# Patient Record
Sex: Female | Born: 1970
Health system: Southern US, Community
[De-identification: ages and names within clinical notes are randomized; demographics above are authoritative.]

## PROBLEM LIST (undated history)

## (undated) DIAGNOSIS — G8929 Other chronic pain: Secondary | ICD-10-CM

## (undated) DIAGNOSIS — E559 Vitamin D deficiency, unspecified: Secondary | ICD-10-CM

## (undated) DIAGNOSIS — L719 Rosacea, unspecified: Secondary | ICD-10-CM

## (undated) DIAGNOSIS — F329 Major depressive disorder, single episode, unspecified: Secondary | ICD-10-CM

## (undated) DIAGNOSIS — M549 Dorsalgia, unspecified: Secondary | ICD-10-CM

## (undated) DIAGNOSIS — B019 Varicella without complication: Secondary | ICD-10-CM

## (undated) DIAGNOSIS — G4700 Insomnia, unspecified: Secondary | ICD-10-CM

## (undated) DIAGNOSIS — N390 Urinary tract infection, site not specified: Secondary | ICD-10-CM

## (undated) DIAGNOSIS — N946 Dysmenorrhea, unspecified: Secondary | ICD-10-CM

## (undated) DIAGNOSIS — J309 Allergic rhinitis, unspecified: Secondary | ICD-10-CM

## (undated) HISTORY — DX: Vitamin D deficiency, unspecified: E55.9

## (undated) HISTORY — DX: Varicella without complication: B01.9

## (undated) HISTORY — PX: DILATION AND CURETTAGE OF UTERUS: SHX78

## (undated) HISTORY — DX: Urinary tract infection, site not specified: N39.0

## (undated) HISTORY — DX: Other chronic pain: G89.29

## (undated) HISTORY — DX: Rosacea, unspecified: L71.9

## (undated) HISTORY — DX: Allergic rhinitis, unspecified: J30.9

## (undated) HISTORY — DX: Major depressive disorder, single episode, unspecified: F32.9

## (undated) HISTORY — DX: Dorsalgia, unspecified: M54.9

## (undated) HISTORY — DX: Dysmenorrhea, unspecified: N94.6

## (undated) HISTORY — DX: Insomnia, unspecified: G47.00

---

## 2004-12-27 ENCOUNTER — Other Ambulatory Visit: Admission: RE | Admit: 2004-12-27 | Discharge: 2004-12-27 | Payer: Self-pay | Admitting: Obstetrics and Gynecology

## 2005-09-09 ENCOUNTER — Inpatient Hospital Stay (HOSPITAL_COMMUNITY): Admission: AD | Admit: 2005-09-09 | Discharge: 2005-09-12 | Payer: Self-pay | Admitting: Obstetrics and Gynecology

## 2007-07-23 DIAGNOSIS — J309 Allergic rhinitis, unspecified: Secondary | ICD-10-CM

## 2007-07-23 HISTORY — DX: Allergic rhinitis, unspecified: J30.9

## 2010-02-16 DIAGNOSIS — L719 Rosacea, unspecified: Secondary | ICD-10-CM

## 2010-02-16 HISTORY — DX: Rosacea, unspecified: L71.9

## 2010-04-03 DIAGNOSIS — G4700 Insomnia, unspecified: Secondary | ICD-10-CM

## 2010-04-03 HISTORY — DX: Insomnia, unspecified: G47.00

## 2010-05-09 DIAGNOSIS — F32A Depression, unspecified: Secondary | ICD-10-CM

## 2010-05-09 HISTORY — DX: Depression, unspecified: F32.A

## 2010-09-13 DIAGNOSIS — E559 Vitamin D deficiency, unspecified: Secondary | ICD-10-CM | POA: Insufficient documentation

## 2010-09-13 DIAGNOSIS — G8929 Other chronic pain: Secondary | ICD-10-CM

## 2010-09-13 HISTORY — DX: Vitamin D deficiency, unspecified: E55.9

## 2010-09-13 HISTORY — DX: Other chronic pain: G89.29

## 2011-03-08 DIAGNOSIS — N946 Dysmenorrhea, unspecified: Secondary | ICD-10-CM

## 2011-03-08 HISTORY — DX: Dysmenorrhea, unspecified: N94.6

## 2015-07-12 LAB — BASIC METABOLIC PANEL
BUN: 8 (ref 4–21)
Creatinine: 0.7 (ref 0.5–1.1)
GLUCOSE: 97
POTASSIUM: 4.4 (ref 3.4–5.3)
SODIUM: 139 (ref 137–147)

## 2015-07-12 LAB — LIPID PANEL
CHOLESTEROL: 235 — AB (ref 0–200)
HDL: 51 (ref 35–70)
LDL Cholesterol: 152
TRIGLYCERIDES: 160 (ref 40–160)

## 2015-07-12 LAB — HEPATIC FUNCTION PANEL
ALT: 39 — AB (ref 7–35)
AST: 34 (ref 13–35)
Alkaline Phosphatase: 60 (ref 25–125)

## 2015-07-12 LAB — VITAMIN D 25 HYDROXY (VIT D DEFICIENCY, FRACTURES): Vit D, 25-Hydroxy: 23

## 2015-10-19 DIAGNOSIS — E785 Hyperlipidemia, unspecified: Secondary | ICD-10-CM | POA: Diagnosis not present

## 2015-10-19 LAB — LIPID PANEL
CHOLESTEROL: 218 — AB (ref 0–200)
HDL: 53 (ref 35–70)
LDL Cholesterol: 135
Triglycerides: 148 (ref 40–160)

## 2016-01-25 DIAGNOSIS — L218 Other seborrheic dermatitis: Secondary | ICD-10-CM | POA: Diagnosis not present

## 2016-01-25 DIAGNOSIS — H0261 Xanthelasma of right upper eyelid: Secondary | ICD-10-CM | POA: Diagnosis not present

## 2016-01-25 DIAGNOSIS — H0264 Xanthelasma of left upper eyelid: Secondary | ICD-10-CM | POA: Diagnosis not present

## 2016-02-13 DIAGNOSIS — H0261 Xanthelasma of right upper eyelid: Secondary | ICD-10-CM | POA: Diagnosis not present

## 2016-02-13 DIAGNOSIS — H0264 Xanthelasma of left upper eyelid: Secondary | ICD-10-CM | POA: Diagnosis not present

## 2016-03-19 DIAGNOSIS — H0261 Xanthelasma of right upper eyelid: Secondary | ICD-10-CM | POA: Diagnosis not present

## 2016-03-19 DIAGNOSIS — H0264 Xanthelasma of left upper eyelid: Secondary | ICD-10-CM | POA: Diagnosis not present

## 2016-04-24 DIAGNOSIS — H0261 Xanthelasma of right upper eyelid: Secondary | ICD-10-CM | POA: Diagnosis not present

## 2016-04-24 DIAGNOSIS — H0264 Xanthelasma of left upper eyelid: Secondary | ICD-10-CM | POA: Diagnosis not present

## 2016-08-04 DIAGNOSIS — R69 Illness, unspecified: Secondary | ICD-10-CM | POA: Diagnosis not present

## 2017-05-28 DIAGNOSIS — M9904 Segmental and somatic dysfunction of sacral region: Secondary | ICD-10-CM | POA: Diagnosis not present

## 2017-05-28 DIAGNOSIS — M5137 Other intervertebral disc degeneration, lumbosacral region: Secondary | ICD-10-CM | POA: Diagnosis not present

## 2017-05-28 DIAGNOSIS — M9902 Segmental and somatic dysfunction of thoracic region: Secondary | ICD-10-CM | POA: Diagnosis not present

## 2017-05-28 DIAGNOSIS — M9903 Segmental and somatic dysfunction of lumbar region: Secondary | ICD-10-CM | POA: Diagnosis not present

## 2017-05-30 DIAGNOSIS — M9902 Segmental and somatic dysfunction of thoracic region: Secondary | ICD-10-CM | POA: Diagnosis not present

## 2017-05-30 DIAGNOSIS — M5137 Other intervertebral disc degeneration, lumbosacral region: Secondary | ICD-10-CM | POA: Diagnosis not present

## 2017-05-30 DIAGNOSIS — M9903 Segmental and somatic dysfunction of lumbar region: Secondary | ICD-10-CM | POA: Diagnosis not present

## 2017-05-30 DIAGNOSIS — M9904 Segmental and somatic dysfunction of sacral region: Secondary | ICD-10-CM | POA: Diagnosis not present

## 2017-06-03 DIAGNOSIS — M5137 Other intervertebral disc degeneration, lumbosacral region: Secondary | ICD-10-CM | POA: Diagnosis not present

## 2017-06-03 DIAGNOSIS — M9903 Segmental and somatic dysfunction of lumbar region: Secondary | ICD-10-CM | POA: Diagnosis not present

## 2017-06-03 DIAGNOSIS — M9902 Segmental and somatic dysfunction of thoracic region: Secondary | ICD-10-CM | POA: Diagnosis not present

## 2017-06-03 DIAGNOSIS — M9904 Segmental and somatic dysfunction of sacral region: Secondary | ICD-10-CM | POA: Diagnosis not present

## 2017-06-06 DIAGNOSIS — M5137 Other intervertebral disc degeneration, lumbosacral region: Secondary | ICD-10-CM | POA: Diagnosis not present

## 2017-06-06 DIAGNOSIS — M9903 Segmental and somatic dysfunction of lumbar region: Secondary | ICD-10-CM | POA: Diagnosis not present

## 2017-06-06 DIAGNOSIS — M9904 Segmental and somatic dysfunction of sacral region: Secondary | ICD-10-CM | POA: Diagnosis not present

## 2017-06-06 DIAGNOSIS — M9902 Segmental and somatic dysfunction of thoracic region: Secondary | ICD-10-CM | POA: Diagnosis not present

## 2017-07-29 ENCOUNTER — Encounter: Payer: Self-pay | Admitting: *Deleted

## 2017-08-06 DIAGNOSIS — M5137 Other intervertebral disc degeneration, lumbosacral region: Secondary | ICD-10-CM | POA: Diagnosis not present

## 2017-08-06 DIAGNOSIS — M9903 Segmental and somatic dysfunction of lumbar region: Secondary | ICD-10-CM | POA: Diagnosis not present

## 2017-08-06 DIAGNOSIS — M9904 Segmental and somatic dysfunction of sacral region: Secondary | ICD-10-CM | POA: Diagnosis not present

## 2017-08-06 DIAGNOSIS — M9902 Segmental and somatic dysfunction of thoracic region: Secondary | ICD-10-CM | POA: Diagnosis not present

## 2017-08-13 ENCOUNTER — Encounter: Payer: Self-pay | Admitting: Family Medicine

## 2017-08-13 ENCOUNTER — Ambulatory Visit (INDEPENDENT_AMBULATORY_CARE_PROVIDER_SITE_OTHER): Payer: BLUE CROSS/BLUE SHIELD | Admitting: Family Medicine

## 2017-08-13 VITALS — BP 131/89 | HR 86 | Temp 98.5°F | Ht 67.0 in | Wt 181.0 lb

## 2017-08-13 DIAGNOSIS — F40243 Fear of flying: Secondary | ICD-10-CM | POA: Diagnosis not present

## 2017-08-13 DIAGNOSIS — Z0000 Encounter for general adult medical examination without abnormal findings: Secondary | ICD-10-CM

## 2017-08-13 DIAGNOSIS — Z79899 Other long term (current) drug therapy: Secondary | ICD-10-CM

## 2017-08-13 DIAGNOSIS — E782 Mixed hyperlipidemia: Secondary | ICD-10-CM | POA: Diagnosis not present

## 2017-08-13 DIAGNOSIS — E559 Vitamin D deficiency, unspecified: Secondary | ICD-10-CM

## 2017-08-13 DIAGNOSIS — L719 Rosacea, unspecified: Secondary | ICD-10-CM | POA: Diagnosis not present

## 2017-08-13 DIAGNOSIS — Z7689 Persons encountering health services in other specified circumstances: Secondary | ICD-10-CM | POA: Diagnosis not present

## 2017-08-13 DIAGNOSIS — Z131 Encounter for screening for diabetes mellitus: Secondary | ICD-10-CM

## 2017-08-13 DIAGNOSIS — E663 Overweight: Secondary | ICD-10-CM

## 2017-08-13 DIAGNOSIS — Z130 Encounter for screening for diseases of the blood and blood-forming organs and certain disorders involving the immune mechanism: Secondary | ICD-10-CM | POA: Diagnosis not present

## 2017-08-13 LAB — COMPREHENSIVE METABOLIC PANEL
ALT: 63 U/L — AB (ref 0–35)
AST: 58 U/L — AB (ref 0–37)
Albumin: 4.1 g/dL (ref 3.5–5.2)
Alkaline Phosphatase: 57 U/L (ref 39–117)
BILIRUBIN TOTAL: 0.4 mg/dL (ref 0.2–1.2)
BUN: 11 mg/dL (ref 6–23)
CHLORIDE: 104 meq/L (ref 96–112)
CO2: 26 mEq/L (ref 19–32)
Calcium: 9.2 mg/dL (ref 8.4–10.5)
Creatinine, Ser: 0.71 mg/dL (ref 0.40–1.20)
GFR: 93.42 mL/min (ref >60.00)
GLUCOSE: 114 mg/dL — AB (ref 70–99)
Potassium: 4.3 mEq/L (ref 3.5–5.1)
Sodium: 141 mEq/L (ref 135–145)
Total Protein: 7.3 g/dL (ref 6.0–8.3)

## 2017-08-13 LAB — LIPID PANEL
CHOL/HDL RATIO: 5
Cholesterol: 247 mg/dL — ABNORMAL HIGH (ref 0–200)
HDL: 53.1 mg/dL (ref >39.00)
LDL CALC: 156 mg/dL — AB (ref 0–99)
NONHDL: 193.74
Triglycerides: 189 mg/dL — ABNORMAL HIGH (ref 0.0–149.0)
VLDL: 37.8 mg/dL (ref 0.0–40.0)

## 2017-08-13 LAB — CBC WITH DIFFERENTIAL/PLATELET
BASOS ABS: 0 10*3/uL (ref 0.0–0.1)
Basophils Relative: 0.9 % (ref 0.0–3.0)
EOS ABS: 0.2 10*3/uL (ref 0.0–0.7)
Eosinophils Relative: 3.7 % (ref 0.0–5.0)
HCT: 46.2 % — ABNORMAL HIGH (ref 36.0–46.0)
Hemoglobin: 15.7 g/dL — ABNORMAL HIGH (ref 12.0–15.0)
LYMPHS ABS: 1.2 10*3/uL (ref 0.7–4.0)
LYMPHS PCT: 23.7 % (ref 12.0–46.0)
MCHC: 33.9 g/dL (ref 30.0–36.0)
MCV: 100.8 fl — ABNORMAL HIGH (ref 78.0–100.0)
Monocytes Absolute: 0.5 10*3/uL (ref 0.1–1.0)
Monocytes Relative: 9.5 % (ref 3.0–12.0)
NEUTROS ABS: 3.2 10*3/uL (ref 1.4–7.7)
NEUTROS PCT: 62.2 % (ref 43.0–77.0)
PLATELETS: 202 10*3/uL (ref 150.0–400.0)
RBC: 4.59 Mil/uL (ref 3.87–5.11)
RDW: 13.1 % (ref 11.5–15.5)
WBC: 5.1 10*3/uL (ref 4.0–10.5)

## 2017-08-13 LAB — HEMOGLOBIN A1C: Hgb A1c MFr Bld: 6 % (ref 4.6–6.5)

## 2017-08-13 LAB — TSH: TSH: 2.08 u[IU]/mL (ref 0.35–4.50)

## 2017-08-13 LAB — VITAMIN D 25 HYDROXY (VIT D DEFICIENCY, FRACTURES): VITD: 25.09 ng/mL — AB (ref 30.00–100.00)

## 2017-08-13 NOTE — Patient Instructions (Signed)
It was nice to meet you today.   Health Maintenance, Female Adopting a healthy lifestyle and getting preventive care can go a long way to promote health and wellness. Talk with your health care provider about what schedule of regular examinations is right for you. This is a good chance for you to check in with your provider about disease prevention and staying healthy. In between checkups, there are plenty of things you can do on your own. Experts have done a lot of research about which lifestyle changes and preventive measures are most likely to keep you healthy. Ask your health care provider for more information. Weight and diet Eat a healthy diet  Be sure to include plenty of vegetables, fruits, low-fat dairy products, and lean protein.  Do not eat a lot of foods high in solid fats, added sugars, or salt.  Get regular exercise. This is one of the most important things you can do for your health. ? Most adults should exercise for at least 150 minutes each week. The exercise should increase your heart rate and make you sweat (moderate-intensity exercise). ? Most adults should also do strengthening exercises at least twice a week. This is in addition to the moderate-intensity exercise.  Maintain a healthy weight  Body mass index (BMI) is a measurement that can be used to identify possible weight problems. It estimates body fat based on height and weight. Your health care provider can help determine your BMI and help you achieve or maintain a healthy weight.  For females 70 years of age and older: ? A BMI below 18.5 is considered underweight. ? A BMI of 18.5 to 24.9 is normal. ? A BMI of 25 to 29.9 is considered overweight. ? A BMI of 30 and above is considered obese.  Watch levels of cholesterol and blood lipids  You should start having your blood tested for lipids and cholesterol at 48 years of age, then have this test every 5 years.  You may need to have your cholesterol levels  checked more often if: ? Your lipid or cholesterol levels are high. ? You are older than 48 years of age. ? You are at high risk for heart disease.  Cancer screening Lung Cancer  Lung cancer screening is recommended for adults 38-28 years old who are at high risk for lung cancer because of a history of smoking.  A yearly low-dose CT scan of the lungs is recommended for people who: ? Currently smoke. ? Have quit within the past 15 years. ? Have at least a 30-pack-year history of smoking. A pack year is smoking an average of one pack of cigarettes a day for 1 year.  Yearly screening should continue until it has been 15 years since you quit.  Yearly screening should stop if you develop a health problem that would prevent you from having lung cancer treatment.  Breast Cancer  Practice breast self-awareness. This means understanding how your breasts normally appear and feel.  It also means doing regular breast self-exams. Let your health care provider know about any changes, no matter how small.  If you are in your 20s or 30s, you should have a clinical breast exam (CBE) by a health care provider every 1-3 years as part of a regular health exam.  If you are 41 or older, have a CBE every year. Also consider having a breast X-ray (mammogram) every year.  If you have a family history of breast cancer, talk to your health care provider about  If you are at high risk for breast cancer, talk to your health care provider about having an MRI and a mammogram every year.  Breast cancer gene (BRCA) assessment is recommended for women who have family members with BRCA-related cancers. BRCA-related cancers include: ? Breast. ? Ovarian. ? Tubal. ? Peritoneal cancers.  Results of the assessment will determine the need for genetic counseling and BRCA1 and BRCA2 testing.  Cervical Cancer Your health care provider may recommend that you be screened regularly for cancer of the  pelvic organs (ovaries, uterus, and vagina). This screening involves a pelvic examination, including checking for microscopic changes to the surface of your cervix (Pap test). You may be encouraged to have this screening done every 3 years, beginning at age 21.  For women ages 30-65, health care providers may recommend pelvic exams and Pap testing every 3 years, or they may recommend the Pap and pelvic exam, combined with testing for human papilloma virus (HPV), every 5 years. Some types of HPV increase your risk of cervical cancer. Testing for HPV may also be done on women of any age with unclear Pap test results.  Other health care providers may not recommend any screening for nonpregnant women who are considered low risk for pelvic cancer and who do not have symptoms. Ask your health care provider if a screening pelvic exam is right for you.  If you have had past treatment for cervical cancer or a condition that could lead to cancer, you need Pap tests and screening for cancer for at least 20 years after your treatment. If Pap tests have been discontinued, your risk factors (such as having a new sexual partner) need to be reassessed to determine if screening should resume. Some women have medical problems that increase the chance of getting cervical cancer. In these cases, your health care provider may recommend more frequent screening and Pap tests.  Colorectal Cancer  This type of cancer can be detected and often prevented.  Routine colorectal cancer screening usually begins at 48 years of age and continues through 48 years of age.  Your health care provider may recommend screening at an earlier age if you have risk factors for colon cancer.  Your health care provider may also recommend using home test kits to check for hidden blood in the stool.  A small camera at the end of a tube can be used to examine your colon directly (sigmoidoscopy or colonoscopy). This is done to check for the  earliest forms of colorectal cancer.  Routine screening usually begins at age 50.  Direct examination of the colon should be repeated every 5-10 years through 48 years of age. However, you may need to be screened more often if early forms of precancerous polyps or small growths are found.  Skin Cancer  Check your skin from head to toe regularly.  Tell your health care provider about any new moles or changes in moles, especially if there is a change in a mole's shape or color.  Also tell your health care provider if you have a mole that is larger than the size of a pencil eraser.  Always use sunscreen. Apply sunscreen liberally and repeatedly throughout the day.  Protect yourself by wearing long sleeves, pants, a wide-brimmed hat, and sunglasses whenever you are outside.  Heart disease, diabetes, and high blood pressure  High blood pressure causes heart disease and increases the risk of stroke. High blood pressure is more likely to develop in: ? People who have blood   who have blood pressure in the high end of the normal range (130-139/85-89 mm Hg). ? People who are overweight or obese. ? People who are African American.  If you are 5-22 years of age, have your blood pressure checked every 3-5 years. If you are 69 years of age or older, have your blood pressure checked every year. You should have your blood pressure measured twice-once when you are at a hospital or clinic, and once when you are not at a hospital or clinic. Record the average of the two measurements. To check your blood pressure when you are not at a hospital or clinic, you can use: ? An automated blood pressure machine at a pharmacy. ? A home blood pressure monitor.  If you are between 29 years and 27 years old, ask your health care provider if you should take aspirin to prevent strokes.  Have regular diabetes screenings. This involves taking a blood sample to check your fasting blood sugar level. ? If you are at a normal weight and  have a low risk for diabetes, have this test once every three years after 48 years of age. ? If you are overweight and have a high risk for diabetes, consider being tested at a younger age or more often. Preventing infection Hepatitis B  If you have a higher risk for hepatitis B, you should be screened for this virus. You are considered at high risk for hepatitis B if: ? You were born in a country where hepatitis B is common. Ask your health care provider which countries are considered high risk. ? Your parents were born in a high-risk country, and you have not been immunized against hepatitis B (hepatitis B vaccine). ? You have HIV or AIDS. ? You use needles to inject street drugs. ? You live with someone who has hepatitis B. ? You have had sex with someone who has hepatitis B. ? You get hemodialysis treatment. ? You take certain medicines for conditions, including cancer, organ transplantation, and autoimmune conditions.  Hepatitis C  Blood testing is recommended for: ? Everyone born from [birth year] through 1966. ? Anyone with known risk factors for hepatitis C.  Sexually transmitted infections (STIs)  You should be screened for sexually transmitted infections (STIs) including gonorrhea and chlamydia if: ? You are sexually active and are younger than 48 years of age. ? You are older than 48 years of age and your health care provider tells you that you are at risk for this type of infection. ? Your sexual activity has changed since you were last screened and you are at an increased risk for chlamydia or gonorrhea. Ask your health care provider if you are at risk.  If you do not have HIV, but are at risk, it may be recommended that you take a prescription medicine daily to prevent HIV infection. This is called pre-exposure prophylaxis (PrEP). You are considered at risk if: ? You are sexually active and do not regularly use condoms or know the HIV status of your partner(s). ? You take drugs by  injection. ? You are sexually active with a partner who has HIV.  Talk with your health care provider about whether you are at high risk of being infected with HIV. If you choose to begin PrEP, you should first be tested for HIV. You should then be tested every 3 months for as long as you are taking PrEP. Pregnancy  If you are premenopausal and you may become pregnant, ask your health care  provider about preconception counseling.  If you may become pregnant, take 400 to 800 micrograms (mcg) of folic acid every day.  If you want to prevent pregnancy, talk to your health care provider about birth control (contraception). Osteoporosis and menopause  Osteoporosis is a disease in which the bones lose minerals and strength with aging. This can result in serious bone fractures. Your risk for osteoporosis can be identified using a bone density scan.  If you are 85 years of age or older, or if you are at risk for osteoporosis and fractures, ask your health care provider if you should be screened.  Ask your health care provider whether you should take a calcium or vitamin D supplement to lower your risk for osteoporosis.  Menopause may have certain physical symptoms and risks.  Hormone replacement therapy may reduce some of these symptoms and risks. Talk to your health care provider about whether hormone replacement therapy is right for you. Follow these instructions at home:  Schedule regular health, dental, and eye exams.  Stay current with your immunizations.  Do not use any tobacco products including cigarettes, chewing tobacco, or electronic cigarettes.  If you are pregnant, do not drink alcohol.  If you are breastfeeding, limit how much and how often you drink alcohol.  Limit alcohol intake to no more than 1 drink per day for nonpregnant women. One drink equals 12 ounces of beer, 5 ounces of wine, or 1 ounces of hard liquor.  Do not use street drugs.  Do not share needles.  Ask  your health care provider for help if you need support or information about quitting drugs.  Tell your health care provider if you often feel depressed.  Tell your health care provider if you have ever been abused or do not feel safe at home. This information is not intended to replace advice given to you by your health care provider. Make sure you discuss any questions you have with your health care provider. Document Released: 11/19/2010 Document Revised: 10/12/2015 Document Reviewed: 02/07/2015 Elsevier Interactive Patient Education  2018 [employer].   Please help [place] help you:  We are honored you have chosen [facility] for your Primary Care home. Below you will find basic instructions that you may need to access in the future. Please help [place] help you by reading the instructions, which cover many of the frequent questions we experience.   Prescription refills and request:  -In order to allow more efficient response time, please call your pharmacy for all refills. They will forward the request electronically to [place]. This allows for the quickest possible response. Request left on a nurse line can take longer to refill, since these are checked as time allows between office patients and other phone calls.  - refill request can take up to 3-5 working days to complete.  - If request is sent electronically and request is appropiate, it is usually completed in 1-2 business days.  - all patients will need to be seen routinely for all chronic medical conditions requiring prescription medications (see follow-up below). If you are overdue for follow up on your condition, you will be asked to make an appointment and we will call in enough medication to cover you until your appointment (up to 30 days).  - all controlled substances will require a face to face visit to request/refill.  - if you desire your prescriptions to go through a new pharmacy, and have an active script at original pharmacy, you  will need  to call your pharmacy and have scripts transferred to new pharmacy. This is completed between the pharmacy locations and not by your provider.    Results: If any images or labs were ordered, it can take up to 1 week to get results depending on the test ordered and the lab/facility running and resulting the test. - Normal or stable results, which do not need further discussion, may be released to your mychart immediately with attached note to you. A call may not be generated for normal results. Please make certain to sign up for mychart. If you have questions on how to activate your mychart you can call the front office.  - If your results need further discussion, our office will attempt to contact you via phone, and if unable to reach you after 2 attempts, we will release your abnormal result to your mychart with instructions.  - All results will be automatically released in mychart after 1 week.  - Your provider will provide you with explanation and instruction on all relevant material in your results. Please keep in mind, results and labs may appear confusing or abnormal to the untrained eye, but it does not mean they are actually abnormal for you personally. If you have any questions about your results that are not covered, or you desire more detailed explanation than what was provided, you should make an appointment with your provider to do so.   Our office handles many outgoing and incoming calls daily. If we have not contacted you within 1 week about your results, please check your mychart to see if there is a message first and if not, then contact our office.  In helping with this matter, you help decrease call volume, and therefore allow [place] to be able to respond to patients needs more efficiently.   Acute office visits (sick visit):  An acute visit is intended for a new problem and are scheduled in shorter time slots to allow schedule openings for patients with new problems. This is the  appropriate visit to discuss a new problem. In order to provide you with excellent quality medical care with proper time for you to explain your problem, have an exam and receive treatment with instructions, these appointments should be limited to one new problem per visit. If you experience a new problem, in which you desire to be addressed, please make an acute office visit, we save openings on the schedule to accommodate you. Please do not save your new problem for any other type of visit, let us take care of it properly and quickly for you.   Follow up visits:  Depending on your condition(s) your provider will need to see you routinely in order to provide you with quality care and prescribe medication(s). Most chronic conditions (Example: hypertension, Diabetes, depression/anxiety... etc), require visits a couple times a year. Your provider will instruct you on proper follow up for your personal medical conditions and history. Please make certain to make follow up appointments for your condition as instructed. Failing to do so could result in lapse in your medication treatment/refills. If you request a refill, and are overdue to be seen on a condition, we will always provide you with a 30 day script (once) to allow you time to schedule.    Medicare wellness (well visit): - we have a wonderful Nurse Maudie Mercury), that will meet with you and provide you will yearly medicare wellness visits. These visits should occur yearly (can not be scheduled less than 1 calendar year  apart) and cover preventive health, immunizations, advance directives and screenings you are entitled to yearly through your medicare benefits. Do not miss out on your entitled benefits, this is when medicare will pay for these benefits to be ordered for you.  These are strongly encouraged by your provider and is the appropriate type of visit to make certain you are up to date with all preventive health benefits. If you have not had your medicare  wellness exam in the last 12 months, please make certain to schedule one by calling the office and schedule your medicare wellness with Maudie Mercury as soon as possible.   Yearly physical (well visit):  - Adults are recommended to be seen yearly for physicals. Check with your insurance and date of your last physical, most insurances require one calendar year between physicals. Physicals include all preventive health topics, screenings, medical exam and labs that are appropriate for gender/age and history. You may have fasting labs needed at this visit. This is a well visit (not a sick visit), new problems should not be covered during this visit (see acute visit).  - Pediatric patients are seen more frequently when they are younger. Your provider will advise you on well child visit timing that is appropriate for your their age. - This is not a medicare wellness visit. Medicare wellness exams do not have an exam portion to the visit. Some medicare companies allow for a physical, some do not allow a yearly physical. If your medicare allows a yearly physical you can schedule the medicare wellness with our nurse Maudie Mercury and have your physical with your provider after, on the same day. Please check with insurance for your full benefits.   Late Policy/No Shows:  - all new patients should arrive 15-30 minutes earlier than appointment to allow [place] time  to  obtain all personal demographics,  insurance information and for you to complete office paperwork. - All established patients should arrive 10-15 minutes earlier than appointment time to update all information and be checked in .  - In our best efforts to run on time, if you are late for your appointment you will be asked to either reschedule or if able, we will work you back into the schedule. There will be a wait time to work you back in the schedule,  depending on availability.  - If you are unable to make it to your appointment as scheduled, please call 24 hours ahead of  time to allow [place] to fill the time slot with someone else who needs to be seen. If you do not cancel your appointment ahead of time, you may be charged a no show fee.

## 2017-08-13 NOTE — Progress Notes (Signed)
Patient ID: Kristin Forbes, female  DOB: 02/25/1971, 47 y.o.   MRN: [id number] Patient Care Team    Relationship Specialty Notifications Start End  Natalia LeatherwoodKuneff, Renee A, DO PCP - General Family Medicine  08/13/17   Ginette [place], [facility]    08/13/17   Arminda ResidesJones, Daniel, MD Consulting Physician Dermatology  08/13/17     Chief Complaint  Patient presents with  . Establish Care    pt is fasting wants a physical.    Subjective:  Kristin Forbes is a 47 y.o.  female present for new patient establishment. All past medical history, surgical history, allergies, family history, immunizations, medications and social history were obtained and entered in the electronic medical record today. All recent labs, ED visits and hospitalizations within the last year were reviewed.  Health maintenance:  Colonoscopy: no fhx, screen at 49 Mammogram: Possible Breast cancer in [place], but not sure. completed: 2017, birads pt reported normal. Dr. Arelia SneddonMcComb office. She has an upcoming appt in May and will have mammogram at that time.  Cervical cancer screening: last pap: 2017, normal per patient.  Completed by: Dr. Arelia SneddonMcComb Immunizations: tdap UTD 09/2008 , Influenza (encouraged yearly) Infectious disease screening: HIV w/ pregnancy, declined repeat testing DEXA: screen at 59 Assistive device: None  Oxygen use: None Patient has a Dental home. Hospitalizations/ED visits: reviewed  [facility] 08/[facility]/2017  Decreased Interest 0  Down, Depressed, Hopeless 0  PHQ - 2 Score 0  Altered sleeping 3  Tired, decreased energy 1  Change in appetite 0  Feeling bad or failure about yourself  0  Trouble concentrating 0  Moving slowly or fidgety/restless 0  Suicidal thoughts 0  PHQ-9 Score 4  Difficult doing work/chores Not difficult at all   GAD 7 : Generalized Anxiety Score 08/13/2017  Nervous, Anxious, on Edge 0  Control/stop worrying 0  Worry too much - different things 0  Trouble  relaxing 0  Restless 0  Easily annoyed or irritable 0  Afraid - awful might happen 0  Total GAD 7 Score 0  Anxiety Difficulty Not difficult at all    Current Exercise Habits: Home exercise routine, Type of exercise: walking, Time (Minutes): 40, Frequency (Times/Week): 2, Weekly Exercise (Minutes/Week): 80, Intensity: Mild   No flowsheet data found.   Immunization History  Administered Date(s) Administered  . Influenza-Unspecified 03/08/2011  . Tdap 09/20/2008    No exam data present  Past Medical History:  Diagnosis Date  . Allergic rhinitis 07/23/2007  . Chicken pox   . Chronic back pain 09/13/2010  . Depression 05/09/2010  . Dysmenorrhea 03/08/2011  . Insomnia 04/03/2010  . Rosacea 02/16/2010  . UTI (urinary tract infection)   . Vitamin D deficiency 09/13/2010   No Known Allergies Past Surgical History:  Procedure Laterality Date  . DILATION AND CURETTAGE OF UTERUS     Family History  Problem Relation Age of Onset  . CAD Mother   . Heart attack Mother 2660  . Heart disease Mother   . Pulmonary fibrosis Mother   . Nephrolithiasis Father   . AAA (abdominal aortic aneurysm) Maternal Grandfather   . Stroke Paternal Grandmother   . Nephrolithiasis Paternal Grandfather    Social History   Socioeconomic History  . Marital status: Married    Spouse name: Not on file  . Number of children: Not on file  . Years of education: Not on file  . Highest education level: Not on file  Occupational History  . Occupation:  Data Analyst  Social Needs  . Financial resource strain: Not on file  . Food insecurity:    Worry: Not on file    Inability: Not on file  . Transportation needs:    Medical: Not on file    Non-medical: Not on file  Tobacco Use  . Smoking status: Never Smoker  . Smokeless tobacco: Never Used  Substance and Sexual Activity  . Alcohol use: Yes    Alcohol/week: 1.8 oz    Types: 3 Glasses of wine per week    Comment: 1-3 glasses of wine weekly  .  Drug use: Never  . Sexual activity: Yes    Partners: Male    Birth control/protection: None  Lifestyle  . Physical activity:    Days per week: Not on file    Minutes per session: Not on file  . Stress: Not on file  Relationships  . Social connections:    Talks on phone: Not on file    Gets together: Not on file    Attends religious service: Not on file    Active member of club or organization: Not on file    Attends meetings of clubs or organizations: Not on file    Relationship status: Not on file  . Intimate partner violence:    Fear of current or ex partner: Not on file    Emotionally abused: Not on file    Physically abused: Not on file    Forced sexual activity: Not on file  Other Topics Concern  . Not on file  Social History Narrative   Married.  Children.   Bachelors in Occupational psychologist.  Works as a Warden/ranger.   Smoke alarms in the home.  Wears her seatbelt.   Feels safe in her relationships.   Allergies as of 08/13/2017   No Known Allergies     Medication List        Accurate as of 08/13/17 12:23 PM. Always use your most recent med list.          DOXYCYCLINE HYCLATE PO Take by mouth.   XANAX PO Take by mouth.       All past medical history, surgical history, allergies, family history, immunizations andmedications were updated in the EMR today and reviewed under the history and medication portions of their EMR.    No results found for this or any previous visit (from the past 2160 hour(s)).  No results found.   ROS: 14 pt review of systems performed and negative (unless mentioned in an HPI)  Objective: BP 131/89 (BP Location: Left Arm, Patient Position: Sitting, Cuff Size: Large)   Pulse 86   Temp 98.5 F (36.9 C) (Oral)   Ht 5\' 7"  (1.702 m)   Wt 181 lb (82.1 kg)   LMP 08/01/2017   SpO2 97%   BMI 28.35 kg/m  Gen: Afebrile. No acute distress. Nontoxic in appearance, well-developed, well-nourished, overweight, pleasant Caucasian female. HENT:  AT. [place]. Bilateral TM visualized and normal in appearance, normal external auditory canal. MMM, no oral lesions, adequate dentition. Bilateral nares within normal limits. Throat without erythema, ulcerations or exudates.  No cough on exam, no hoarseness on exam. Eyes:Pupils Equal Round Reactive to light, Extraocular movements intact,  Conjunctiva without redness, discharge or icterus. Neck/lymp/endocrine: Supple, no lymphadenopathy, no thyromegaly CV: RRR no murmur, no edema, +2/4 P posterior tibialis pulses.  No carotid bruits. No JVD. Chest: CTAB, no wheeze, rhonchi or crackles.  Normal respiratory effort.  Good air movement. Abd: Soft.  Flat. NTND. BS present.  No masses palpated. No hepatosplenomegaly. No rebound tenderness or guarding. Skin: No rashes, purpura or petechiae. Warm and well-perfused. Skin intact. Neuro/Msk:  Normal gait. PERLA. EOMi. Alert. Oriented x3.  Cranial nerves II through XII intact. Muscle strength 5/5 upper/lower extremity. DTRs equal bilaterally. Psych: Normal affect, dress and demeanor. Normal speech. Normal thought content and judgment.   Assessment/plan: Kristin Forbes is a 47 y.o. female present for patient establishment with a physical. Mixed hyperlipidemia Patient reports she does not take any medications or fish oil supplement for this condition. - Lipid panel - TSH Rosacea Patient is established with dermatology for condition.  Takes daily doxycycline to control. Flying phobia Prescribed low-dose Xanax in the past by prior providers.  She does not need refills today.  Patient is aware this is a controlled substance.  If needing refills she will need to be seen face-to-face.  She reports she understands this policy and law. Diabetes mellitus screening - Hemoglobin A1c Screening for deficiency anemia - CBC with Differential/Platelet Encounter for long-term current use of medication -Daily doxycycline prescribed by dermatology for rosacea -  Comprehensive metabolic panel Vitamin D deficiency -Currently takes a over-the-counter supplementation, she is uncertain how many units. - Vitamin D (25 hydroxy) Overweight (BMI 25.0-29.9) Exercise greater than 150 minutes a week. Encounter for preventive health examination/new patient Patient was encouraged to exercise greater than 150 minutes a week. Patient was encouraged to choose a diet filled with fresh fruits and vegetables, and lean meats. AVS provided to patient today for education/recommendation on gender specific health and safety maintenance.  Return in about 1 year (around 08/14/2018) for CPE.   Note is dictated utilizing voice recognition software. Although note has been proof read prior to signing, occasional typographical errors still can be missed. If any questions arise, please do not hesitate to call for verification.  Electronically signed by: Felix Pacini, DO  [facility] [place]

## 2017-08-14 ENCOUNTER — Telehealth: Payer: Self-pay | Admitting: Family Medicine

## 2017-08-14 DIAGNOSIS — R7989 Other specified abnormal findings of blood chemistry: Secondary | ICD-10-CM | POA: Insufficient documentation

## 2017-08-14 DIAGNOSIS — R945 Abnormal results of liver function studies: Principal | ICD-10-CM

## 2017-08-14 DIAGNOSIS — R7303 Prediabetes: Secondary | ICD-10-CM | POA: Insufficient documentation

## 2017-08-14 NOTE — Telephone Encounter (Signed)
Please inform patient the following information: - her diabetes screen is "prediabetes" at 6.0. No medication needed at this time, however we will need to monitor every 6 months to ensure she doe snot progress to diabetes. Lower sugar/carbs encouraged.  - vit d is mildly low at 25. She reported she was taking a supplement but did not know how much. Would recommend at least 1000u since she is low. If already taking more than 1000u increase by one extra dose a week.  - her liver enzymes are elevated. I do not have prior records or labs for her to know if this is a change. Some people with elevated cholesterol have "fatty liver disease" which can cause liver enzyme elevation. However this should be repeated/verified and if truly elevated additional labs and possibly US of liver would be warranted to rule out other cause such as liver disease.  Her cholesterol level is higher than desired. Even without her family history (which is + HD), I would recommend cholesterol medication for her since LDL (bad cholesterol) is near 160. However we should wait on statin until we find out why her LFT is elevated, since this medication passes through liver.  - diet higher in fiber (take a fiber supplement if necessary), lower started fats is helpful. Increasing exercise > 150 minutes a week and fish oil supplement 4 g is recommended for now. We will repeat liver test and cholesterol in 3 months by provider appt and if needed further work up will be initiated if needed based on repeat results.    Lipid Panel     Component Value Date/Time   CHOL 247 (H) 08/13/2017 0924   TRIG 189.0 (H) 08/13/2017 0924   HDL 53.10 08/13/2017 0924   CHOLHDL 5 08/13/2017 0924   VLDL 37.8 08/13/2017 0924   LDLCALC 156 (H) 08/13/2017 [id number]

## 2017-08-14 NOTE — Telephone Encounter (Signed)
Spoke with patient reviewed lab results and instructions. Patient verbalized understanding. Scheduled patient to be seen in 3 months

## 2017-08-14 NOTE — Telephone Encounter (Signed)
Left message for patient to return call to review lab results. 

## 2017-10-15 ENCOUNTER — Encounter: Payer: Self-pay | Admitting: *Deleted

## 2017-11-12 ENCOUNTER — Ambulatory Visit: Payer: BLUE CROSS/BLUE SHIELD | Admitting: Family Medicine

## 2017-11-24 ENCOUNTER — Ambulatory Visit: Payer: BLUE CROSS/BLUE SHIELD | Admitting: Family Medicine

## 2017-11-24 DIAGNOSIS — Z01419 Encounter for gynecological examination (general) (routine) without abnormal findings: Secondary | ICD-10-CM | POA: Diagnosis not present

## 2017-11-24 DIAGNOSIS — Z1231 Encounter for screening mammogram for malignant neoplasm of breast: Secondary | ICD-10-CM | POA: Diagnosis not present

## 2017-11-24 DIAGNOSIS — Z6828 Body mass index (BMI) 28.0-28.9, adult: Secondary | ICD-10-CM | POA: Diagnosis not present

## 2017-12-03 ENCOUNTER — Encounter: Payer: Self-pay | Admitting: Family Medicine

## 2017-12-03 ENCOUNTER — Ambulatory Visit (INDEPENDENT_AMBULATORY_CARE_PROVIDER_SITE_OTHER): Payer: BLUE CROSS/BLUE SHIELD | Admitting: Family Medicine

## 2017-12-03 VITALS — BP 136/83 | HR 73 | Temp 98.1°F | Resp 20 | Ht 67.0 in | Wt 180.0 lb

## 2017-12-03 DIAGNOSIS — D582 Other hemoglobinopathies: Secondary | ICD-10-CM

## 2017-12-03 DIAGNOSIS — D7589 Other specified diseases of blood and blood-forming organs: Secondary | ICD-10-CM

## 2017-12-03 DIAGNOSIS — E663 Overweight: Secondary | ICD-10-CM

## 2017-12-03 DIAGNOSIS — E782 Mixed hyperlipidemia: Secondary | ICD-10-CM | POA: Diagnosis not present

## 2017-12-03 DIAGNOSIS — R7989 Other specified abnormal findings of blood chemistry: Secondary | ICD-10-CM

## 2017-12-03 DIAGNOSIS — R945 Abnormal results of liver function studies: Secondary | ICD-10-CM

## 2017-12-03 LAB — LIPID PANEL
CHOL/HDL RATIO: 4
Cholesterol: 247 mg/dL — ABNORMAL HIGH (ref 0–200)
HDL: 55.2 mg/dL (ref >39.00)
LDL Cholesterol: 166 mg/dL — ABNORMAL HIGH (ref 0–99)
NonHDL: 191.32
TRIGLYCERIDES: 129 mg/dL (ref 0.0–149.0)
VLDL: 25.8 mg/dL (ref 0.0–40.0)

## 2017-12-03 LAB — CBC WITH DIFFERENTIAL/PLATELET
BASOS ABS: 0 10*3/uL (ref 0.0–0.1)
Basophils Relative: 0.7 % (ref 0.0–3.0)
Eosinophils Absolute: 0.2 10*3/uL (ref 0.0–0.7)
Eosinophils Relative: 2.7 % (ref 0.0–5.0)
HCT: 46 % (ref 36.0–46.0)
Hemoglobin: 15.6 g/dL — ABNORMAL HIGH (ref 12.0–15.0)
Lymphocytes Relative: 18.7 % (ref 12.0–46.0)
Lymphs Abs: 1 10*3/uL (ref 0.7–4.0)
MCHC: 33.9 g/dL (ref 30.0–36.0)
MCV: 101.8 fl — ABNORMAL HIGH (ref 78.0–100.0)
Monocytes Absolute: 0.5 10*3/uL (ref 0.1–1.0)
Monocytes Relative: 8.8 % (ref 3.0–12.0)
NEUTROS ABS: 3.9 10*3/uL (ref 1.4–7.7)
NEUTROS PCT: 69.1 % (ref 43.0–77.0)
PLATELETS: 174 10*3/uL (ref 150.0–400.0)
RBC: 4.52 Mil/uL (ref 3.87–5.11)
RDW: 13 % (ref 11.5–15.5)
WBC: 5.6 10*3/uL (ref 4.0–10.5)

## 2017-12-03 LAB — COMPREHENSIVE METABOLIC PANEL
ALK PHOS: 55 U/L (ref 39–117)
ALT: 42 U/L — AB (ref 0–35)
AST: 41 U/L — AB (ref 0–37)
Albumin: 4.1 g/dL (ref 3.5–5.2)
BILIRUBIN TOTAL: 0.6 mg/dL (ref 0.2–1.2)
BUN: 9 mg/dL (ref 6–23)
CALCIUM: 9 mg/dL (ref 8.4–10.5)
CO2: 26 meq/L (ref 19–32)
CREATININE: 0.81 mg/dL (ref 0.40–1.20)
Chloride: 103 mEq/L (ref 96–112)
GFR: 80.14 mL/min (ref >60.00)
GLUCOSE: 110 mg/dL — AB (ref 70–99)
Potassium: 4.1 mEq/L (ref 3.5–5.1)
Sodium: 138 mEq/L (ref 135–145)
TOTAL PROTEIN: 7.1 g/dL (ref 6.0–8.3)

## 2017-12-03 NOTE — Patient Instructions (Signed)
Fatty Liver Fatty liver, also called hepatic steatosis or steatohepatitis, is a condition in which too much fat has built up in your liver cells. The liver removes harmful substances from your bloodstream. It produces fluids your body needs. It also helps your body use and store energy from the food you eat. In many cases, fatty liver does not cause symptoms or problems. It is often diagnosed when tests are being done for other reasons. However, over time, fatty liver can cause inflammation that may lead to more serious liver problems, such as scarring of the liver (cirrhosis). What are the causes? Causes of fatty liver may include:  Drinking too much alcohol.  Poor nutrition.  Obesity.  Cushing syndrome.  Diabetes.  Hyperlipidemia.  Pregnancy.  Certain drugs.  Poisons.  Some viral infections.  What increases the risk? You may be more likely to develop fatty liver if you:  Abuse alcohol.  Are pregnant.  Are overweight.  Have diabetes.  Have hepatitis.  Have a high triglyceride level.  What are the signs or symptoms? Fatty liver often does not cause any symptoms. In cases where symptoms develop, they can include:  Fatigue.  Weakness.  Weight loss.  Confusion.  Abdominal pain.  Yellowing of your skin and the white parts of your eyes (jaundice).  Nausea and vomiting.  How is this diagnosed? Fatty liver may be diagnosed by:  Physical exam and medical history.  Blood tests.  Imaging tests, such as an ultrasound, CT scan, or MRI.  Liver biopsy. A small sample of liver tissue is removed using a needle. The sample is then looked at under a microscope.  How is this treated? Fatty liver is often caused by other health conditions. Treatment for fatty liver may involve medicines and lifestyle changes to manage conditions such as:  Alcoholism.  High cholesterol.  Diabetes.  Being overweight or obese.  Follow these instructions at home:  Eat a  healthy diet as directed by your health care provider.  Exercise regularly. This can help you lose weight and control your cholesterol and diabetes. Talk to your health care provider about an exercise plan and which activities are best for you.  Do not drink alcohol.  Take medicines only as directed by your health care provider. Contact a health care provider if: You have difficulty controlling your:  Blood sugar.  Cholesterol.  Alcohol consumption.  Get help right away if:  You have abdominal pain.  You have jaundice.  You have nausea and vomiting. This information is not intended to replace advice given to you by your health care provider. Make sure you discuss any questions you have with your health care provider. Document Released: 06/21/2005 Document Revised: 10/12/2015 Document Reviewed: 09/15/2013 Elsevier Interactive Patient Education  2018 Elsevier Inc.  

## 2017-12-03 NOTE — Progress Notes (Signed)
Kristin Forbes , 12-Nov-1970, 47 y.o., female MRN: [id number] Patient Care Team    Relationship Specialty Notifications Start End  Ma Hillock, DO PCP - General Family Medicine  08/13/17   [place], [facility]    08/13/17   Danella Sensing, MD Consulting Physician Dermatology  08/13/17     Chief Complaint  Patient presents with  . Follow-up    repeat labs     Subjective: Pt presents for an OV for follow-up on abnormal labs. Elevated LFTs/Elevated hemoglobin (HCC)/macrocytosis Patient was seen August 13, 2017 and found to have elevated liver enzymes; ALT 63 and AST 58.  Mildly abnormal liver enzymes with an ALT 39 and 2017.  Patient presents today for follow-up and repeat testing.  Reports she does consume alcohol approximately 2 drinks daily.  Non-smoker.  She reports she does believe she snores mildly but denies any apneic spells or daytime somnolence.  Mixed hyperlipidemia/Overweight (BMI 25.0-29.9) Cholesterol was elevated August 13, 2017 with a total cholesterol 247, HDL 53, LDL 156, triglycerides 189.  Reports she did start fish oil supplementation but she does not recall the dosage.  Has not changed her diet and exercise.  Daily history of heart disease in her mother, AAA and her maternal grandfather, stroke in her paternal grandmother.   Depression screen [facility] 2/9 08/13/2017  Decreased Interest 0  Down, Depressed, Hopeless 0  PHQ - 2 Score 0  Altered sleeping 3  Tired, decreased energy 1  Change in appetite 0  Feeling bad or failure about yourself  0  Trouble concentrating 0  Moving slowly or fidgety/restless 0  Suicidal thoughts 0  PHQ-9 Score 4  Difficult doing work/chores Not difficult at all    No Known Allergies Social History   Tobacco Use  . Smoking status: Never Smoker  . Smokeless tobacco: Never Used  Substance Use Topics  . Alcohol use: Yes    Alcohol/week: 1.8 oz    Types: 3 Glasses of wine per week    Comment: 1-3 glasses of wine  weekly   Past Medical History:  Diagnosis Date  . Allergic rhinitis 07/23/2007  . Chicken pox   . Chronic back pain 09/13/2010  . Depression 05/09/2010  . Dysmenorrhea 03/08/2011  . Insomnia 04/03/2010  . Rosacea 02/16/2010  . UTI (urinary tract infection)   . Vitamin D deficiency 09/13/2010   Past Surgical History:  Procedure Laterality Date  . DILATION AND CURETTAGE OF UTERUS     Family History  Problem Relation Age of Onset  . CAD Mother   . Heart attack Mother 85  . Heart disease Mother   . Pulmonary fibrosis Mother   . Nephrolithiasis Father   . AAA (abdominal aortic aneurysm) Maternal Grandfather   . Stroke Paternal Grandmother   . Nephrolithiasis Paternal Grandfather    Allergies as of 12/03/2017   No Known Allergies     Medication List        Accurate as of 12/03/17  9:09 AM. Always use your most recent med list.          DOXYCYCLINE HYCLATE PO Take 100 mg by mouth daily.   ibuprofen 800 MG tablet Commonly known as:  ADVIL,MOTRIN   XANAX PO Take 0.25 mg by mouth as needed.       All past medical history, surgical history, allergies, family history, immunizations andmedications were updated in the EMR today and reviewed under the history and medication portions of their EMR.     ROS:  Negative, with the exception of above mentioned in HPI   Objective:  BP 136/83 (BP Location: Right Arm, Patient Position: Sitting, Cuff Size: Large)   Pulse 73   Temp 98.1 F (36.7 C)   Resp 20   Ht '5\' 7"'  (1.702 m)   Wt 180 lb (81.6 kg)   LMP 11/01/2017   SpO2 97%   BMI 28.19 kg/m  Body mass index is 28.19 kg/m. Gen: Afebrile. No acute distress. Nontoxic in appearance, well developed, well nourished.  HENT: AT. [place]. MMM Eyes:Pupils Equal Round Reactive to light, Extraocular movements intact,  Conjunctiva without redness, discharge or icterus. Neck/lymp/endocrine: Supple, no lymphadenopathy CV: RRR no murmur, no edema Chest: CTAB, no wheeze or crackles. Good  air movement, normal resp effort.  Abd: Soft. NTND. BS present.  No masses palpated. No rebound or guarding.  Skin: no rashes, purpura or petechiae.  Neuro:  Normal gait. PERLA. EOMi. Alert. Oriented x3  Psych: Normal affect, dress and demeanor. Normal speech. Normal thought content and judgment.  No exam data present No results found. No results found for this or any previous visit (from the past 24 hour(s)).  Assessment/Plan: Kristin Forbes is a 47 y.o. female present for OV for  Elevated LFTs/Mixed hyperlipidemia/overweight Discussed her liver enzymes with her today.  Could be secondary to a fatty liver disease or her alcohol use, or both. -Repeat labs today for verification.  If it continues to be abnormal will proceed with infectious disease work-up, ultrasound and possibly gastroenterology if felt needed. -Cholesterol remains elevated could consider Zetia - Comp Met (CMET) - Pathologist smear review - Lipid panel  Elevated hemoglobin (HCC)/macrocytosis Repeat labs today.  She is not a smoker.  She does endorse daily alcohol use. -Recommended she avoid alcohol daily, no more than 1 glass of alcohol at a sitting. -Start B complex.   -Repeat labs today for verification.  If remains elevated will proceed with evaluation with B12/folate and consider hematology referral. - CBC w/Diff - Pathologist smear review   Reviewed expectations re: course of current medical issues.  Discussed self-management of symptoms.  Outlined signs and symptoms indicating need for more acute intervention.  Patient verbalized understanding and all questions were answered.  Patient received an After-Visit Summary.    No orders of the defined types were placed in this encounter.    Note is dictated utilizing voice recognition software. Although note has been proof read prior to signing, occasional typographical errors still can be missed. If any questions arise, please do not hesitate to call  for verification.   electronically signed by:  Howard Pouch, DO  [place]

## 2017-12-04 LAB — PATHOLOGIST SMEAR REVIEW

## 2017-12-05 ENCOUNTER — Telehealth: Payer: Self-pay | Admitting: Family Medicine

## 2017-12-05 ENCOUNTER — Encounter: Payer: Self-pay | Admitting: Family Medicine

## 2017-12-05 DIAGNOSIS — D7589 Other specified diseases of blood and blood-forming organs: Secondary | ICD-10-CM | POA: Insufficient documentation

## 2017-12-05 DIAGNOSIS — E782 Mixed hyperlipidemia: Secondary | ICD-10-CM

## 2017-12-05 DIAGNOSIS — R945 Abnormal results of liver function studies: Principal | ICD-10-CM

## 2017-12-05 DIAGNOSIS — D582 Other hemoglobinopathies: Secondary | ICD-10-CM

## 2017-12-05 DIAGNOSIS — R7989 Other specified abnormal findings of blood chemistry: Secondary | ICD-10-CM

## 2017-12-05 MED ORDER — EZETIMIBE 10 MG PO TABS: 10.0000 mg | ORAL_TABLET | Freq: Every day | ORAL | 3 refills | Status: DC

## 2017-12-05 NOTE — Telephone Encounter (Signed)
Please inform patient the following information: Her liver enzymes remain elevated although mildly improved from last collection.  Her hemoglobin remained elevated and the size of her red blood cells also remained elevated. -I have placed additional labs, the ones we discussed during her visit, to further investigate cause of her elevated liver enzymes.  She can complete these via lab appointment only since they were discussed during her visit. I  Have also [facility] an [facility], which they will call to schedule her for that as well.  Once we get all the results back we will call and discuss results and plan. -I would recommend she start a B complex vitamin if she has not done so already.  Avoid alcohol consumption daily, and no more than 1 glass at a setting both of those can cause large red blood cells. -I have referred her to a blood specialist, called a  hematologist, to further evaluate her elevated hemoglobin.   Lastly, I have called in a medication called Zetia.  This is a medication to help with her cholesterol.  This is not a statin medication.  Follow-up in 3 months repeat fasting labs.

## 2017-12-05 NOTE — Telephone Encounter (Signed)
Spoke with patient reviewed lab results and instructions. Patient verbalized understanding.scheduled lab appt. Patient aware of [facility] time [facility].

## 2017-12-05 NOTE — Telephone Encounter (Signed)
Please let patient know that her US has been scheduled 12/16/17 arrive 8:55 [facility] [address], [place]. [place] after midnight. If patient cannot go that day she can reschedule by calling [phone].

## 2017-12-10 ENCOUNTER — Other Ambulatory Visit (INDEPENDENT_AMBULATORY_CARE_PROVIDER_SITE_OTHER): Payer: BLUE CROSS/BLUE SHIELD

## 2017-12-10 DIAGNOSIS — R945 Abnormal results of liver function studies: Secondary | ICD-10-CM

## 2017-12-10 DIAGNOSIS — D7589 Other specified diseases of blood and blood-forming organs: Secondary | ICD-10-CM

## 2017-12-10 DIAGNOSIS — R7989 Other specified abnormal findings of blood chemistry: Secondary | ICD-10-CM

## 2017-12-10 LAB — B12 AND FOLATE PANEL
Folate: 14.7 ng/mL (ref >5.9)
Vitamin B-12: 348 pg/mL (ref 211–911)

## 2017-12-12 ENCOUNTER — Telehealth: Payer: Self-pay | Admitting: Family Medicine

## 2017-12-12 NOTE — Telephone Encounter (Signed)
Please inform patient the following information: Her labs are all normal so far.  B12 is in the lower normal range.   I am still waiting on the B1 vitamin result which takes a little longer.  Wanted her to have the results before the weekend since I likely will not have the B1 back until Monday.  Folate, HIV and hepatitis panel all normal

## 2017-12-12 NOTE — Telephone Encounter (Signed)
Spoke with patient reviewed lab results and instructions. Patient verbalized understanding. 

## 2017-12-13 LAB — HIV ANTIBODY (ROUTINE TESTING W REFLEX): HIV: NONREACTIVE

## 2017-12-13 LAB — HEPATITIS PANEL, ACUTE
HEP B C IGM: NONREACTIVE
Hep A IgM: NONREACTIVE
Hepatitis B Surface Ag: NONREACTIVE
Hepatitis C Ab: NONREACTIVE
SIGNAL TO CUT-OFF: 0.01 (ref <1.00)

## 2017-12-13 LAB — VITAMIN B1: VITAMIN B1 (THIAMINE): 10 nmol/L (ref 8–30)

## 2017-12-16 ENCOUNTER — Other Ambulatory Visit: Payer: BLUE CROSS/BLUE SHIELD

## 2017-12-25 ENCOUNTER — Ambulatory Visit
Admission: RE | Admit: 2017-12-25 | Discharge: 2017-12-25 | Disposition: A | Discharge status: Home / Self Care | Payer: BLUE CROSS/BLUE SHIELD | Source: Ambulatory Visit | Attending: Family Medicine | Admitting: Family Medicine

## 2017-12-25 DIAGNOSIS — K760 Fatty (change of) liver, not elsewhere classified: Secondary | ICD-10-CM | POA: Diagnosis not present

## 2017-12-25 DIAGNOSIS — R7989 Other specified abnormal findings of blood chemistry: Secondary | ICD-10-CM

## 2017-12-25 DIAGNOSIS — R945 Abnormal results of liver function studies: Principal | ICD-10-CM

## 2017-12-26 ENCOUNTER — Encounter: Payer: Self-pay | Admitting: Family Medicine

## 2017-12-26 DIAGNOSIS — K760 Fatty (change of) liver, not elsewhere classified: Secondary | ICD-10-CM | POA: Insufficient documentation

## 2017-12-31 ENCOUNTER — Encounter: Payer: Self-pay | Admitting: Hematology

## 2017-12-31 ENCOUNTER — Telehealth: Payer: Self-pay | Admitting: Hematology

## 2017-12-31 NOTE — Telephone Encounter (Signed)
Pt called and rescheduled appt to 9/11 at 10am.

## 2017-12-31 NOTE — Telephone Encounter (Signed)
Late entry: on 8/5 pt has been scheduled to see Dr. Candise CheKale on 8/28 at 10am. Letter mailed to the pt.

## 2018-01-14 ENCOUNTER — Encounter: Payer: BLUE CROSS/BLUE SHIELD | Admitting: Hematology

## 2018-01-20 ENCOUNTER — Telehealth: Payer: Self-pay | Admitting: Family Medicine

## 2018-01-20 NOTE — Telephone Encounter (Signed)
Please call pt: We received notification she cancelled her appt with the blood specialist. I strongly encourage her to reschedule that appt.

## 2018-01-21 ENCOUNTER — Encounter: Payer: Self-pay | Admitting: *Deleted

## 2018-01-21 NOTE — Telephone Encounter (Signed)
Sent patient message in MY Chart. 

## 2018-01-28 ENCOUNTER — Encounter: Payer: BLUE CROSS/BLUE SHIELD | Admitting: Hematology

## 2018-04-01 ENCOUNTER — Encounter: Payer: Self-pay | Admitting: Family Medicine

## 2018-04-02 ENCOUNTER — Telehealth: Payer: Self-pay | Admitting: Family Medicine

## 2018-04-02 NOTE — Telephone Encounter (Signed)
Spoke with patient reviewed information and scheduled patient to be seen by Dr Claiborne BillingsKuneff.

## 2018-04-02 NOTE — Telephone Encounter (Signed)
I have received a request from pt for a motion sickness med prescription for her cruise leaving 11/22.  - prescribed medications for motion sickness do not come without risk, some high cost  or side effect potential. I would be happy to counsel her on multiple ways to naturally help prevent motion sickness (ginger/accupressure wrist brands etc) and discuss the different prescribed meds and see if one is a fit and safe her.  - it looks like she was due for a f/u last month for her cholesterol- we could combine those appts if she desires.

## 2018-04-08 DIAGNOSIS — M9903 Segmental and somatic dysfunction of lumbar region: Secondary | ICD-10-CM | POA: Diagnosis not present

## 2018-04-08 DIAGNOSIS — M9904 Segmental and somatic dysfunction of sacral region: Secondary | ICD-10-CM | POA: Diagnosis not present

## 2018-04-08 DIAGNOSIS — M9902 Segmental and somatic dysfunction of thoracic region: Secondary | ICD-10-CM | POA: Diagnosis not present

## 2018-04-08 DIAGNOSIS — M5137 Other intervertebral disc degeneration, lumbosacral region: Secondary | ICD-10-CM | POA: Diagnosis not present

## 2018-04-09 ENCOUNTER — Encounter: Payer: Self-pay | Admitting: Family Medicine

## 2018-04-09 ENCOUNTER — Ambulatory Visit: Payer: BLUE CROSS/BLUE SHIELD | Admitting: Family Medicine

## 2018-04-09 VITALS — BP 132/89 | HR 71 | Temp 97.9°F | Resp 20 | Ht 67.0 in | Wt 179.5 lb

## 2018-04-09 DIAGNOSIS — R7303 Prediabetes: Secondary | ICD-10-CM

## 2018-04-09 DIAGNOSIS — T753XXA Motion sickness, initial encounter: Secondary | ICD-10-CM | POA: Diagnosis not present

## 2018-04-09 DIAGNOSIS — E782 Mixed hyperlipidemia: Secondary | ICD-10-CM

## 2018-04-09 DIAGNOSIS — M545 Low back pain, unspecified: Secondary | ICD-10-CM

## 2018-04-09 LAB — HEMOGLOBIN A1C: Hgb A1c MFr Bld: 6 % (ref 4.6–6.5)

## 2018-04-09 LAB — LIPID PANEL
CHOL/HDL RATIO: 4
Cholesterol: 227 mg/dL — ABNORMAL HIGH (ref 0–200)
HDL: 51.3 mg/dL (ref >39.00)
LDL CALC: 146 mg/dL — AB (ref 0–99)
NONHDL: 175.32
TRIGLYCERIDES: 147 mg/dL (ref 0.0–149.0)
VLDL: 29.4 mg/dL (ref 0.0–40.0)

## 2018-04-09 MED ORDER — METHYLPREDNISOLONE ACETATE 80 MG/ML IJ SUSP
80.0000 mg | Freq: Once | INTRAMUSCULAR | Status: AC
  Administered 2018-04-09: 80 mg via INTRAMUSCULAR

## 2018-04-09 MED ORDER — SCOPOLAMINE 1 MG/3DAYS TD PT72: 1.0000 | MEDICATED_PATCH | TRANSDERMAL | 0 refills | Status: DC

## 2018-04-09 NOTE — Patient Instructions (Signed)
Motion Sickness Motion sickness can happen when you travel in a boat, car, or airplane, or when you go on an amusement park ride. You may feel dizzy, feel sick to your stomach (nauseous), or have sweating or belly (abdominal) pain. You may throw up (vomit). These problems usually go away when the motion or traveling stops. Follow these instructions at home: General instructions  Take medicines only as told by your doctor.  If you wear a motion sickness patch, wash your hands after you put the patch on.  Drink enough fluid to keep your pee (urine) clear or pale yellow. Prevention To prevent motion sickness from happening again:  Avoid activities that cause your motion sickness.  Do not eat large meals before or during travel. If you are traveling far, eat small, bland meals.  Do not drink alcohol before or during travel.  Sit in an area of the vehicle with the least motion. ? On an airplane, sit near the wing. Lie back in your seat. ? On a boat, sit near the middle. ? When you ride in a car, sit in the front seat. Avoid the backseat.  Breathe slowly and deeply while you ride in a moving vehicle.  Do not read or focus on nearby objects when you ride in a moving vehicle. ? Watching the horizon can be helpful, especially on a boat. ? In a car, ride in the front seat and look out the front window.  Do not smoke before or during travel. Avoid areas where people are smoking.  Get some fresh air if you can. Open a window when you are riding in a car.  Plan ahead for travel. Ask your doctor if you should take medicines to help prevent motion sickness.  Contact a doctor if:  You still throw up or feel sick to your stomach after 24 hours.  You see blood in your vomit. The blood might be dark red, or it may look like coffee grounds.  You pass out (faint) or you feel dizzy when you stand up.  You have a fever. Get help right away if:  You have bad belly pain or chest pain.  You  have trouble breathing.  You have a bad headache.  You lose feeling (have numbness) or feel weak on one side of your body.  You have trouble speaking. This information is not intended to replace advice given to you by your health care provider. Make sure you discuss any questions you have with your health care provider. Document Released: 04/25/2011 Document Revised: 10/12/2015 Document Reviewed: 04/13/2014 Elsevier Interactive Patient Education  2018 Elsevier Inc.  

## 2018-04-09 NOTE — Progress Notes (Signed)
Kristin Forbes , 10/12/1970, 47 y.o., female MRN: [id number] Patient Care Team    Relationship Specialty Notifications Start End  Natalia LeatherwoodKuneff,  A, DO PCP - General Family Medicine  08/13/17   Ginette [place], [facility]    08/13/17   Arminda ResidesJones, Daniel, MD Consulting Physician Dermatology  08/13/17     Chief Complaint  Patient presents with  . Hyperlipidemia     Subjective: Pt presents for an OV for Motion sickness: She is scheduled to go on a cruise this week and has experienced sea sickness in the past. She is wondering if there is a medication she can take to prevent this from occurring. She tried dramamine before for this, but it made her tired.   Elevated LFTs/Elevated hemoglobin (HCC)/macrocytosis Patient was seen August 13, 2017 and found to have elevated liver enzymes; ALT 63 and AST 58.  Mildly abnormal liver enzymes with an ALT 39 and 2017. Reports she does consume alcohol approximately 2 drinks daily.  Non-smoker.  She was referred and cancelled her appt to [facility].   Mixed hyperlipidemia/Overweight (BMI 25.0-29.9)/prediabetes Fhx  of heart disease in her mother, AAA and her maternal grandfather, stroke in her paternal grandmother. She started Zeita and fish oil. Last a1c 6.0 in March 2019.  Lipid Panel     Component Value Date/Time   CHOL 247 (H) 12/03/2017 0922   TRIG 129.0 12/03/2017 0922   HDL 55.20 12/03/2017 0922   CHOLHDL 4 12/03/2017 0922   VLDL 25.8 12/03/2017 0922   LDLCALC 166 (H) 12/03/2017 0922    Depression screen PHQ 2/9 04/09/2018 08/13/2017  Decreased Interest 0 0  Down, Depressed, Hopeless 0 0  PHQ - 2 Score 0 0  Altered sleeping 0 3  Tired, decreased energy 0 1  Change in appetite 0 0  Feeling bad or failure about yourself  0 0  Trouble concentrating 0 0  Moving slowly or fidgety/restless 0 0  Suicidal thoughts 0 0  PHQ-9 Score 0 4  Difficult doing work/chores Not difficult at all Not difficult at all    No Known Allergies Social  History   Tobacco Use  . Smoking status: Never Smoker  . Smokeless tobacco: Never Used  Substance Use Topics  . Alcohol use: Yes    Alcohol/week: 3.0 standard drinks    Types: 3 Glasses of wine per week    Comment: 1-3 glasses of wine weekly   Past Medical History:  Diagnosis Date  . Allergic rhinitis 07/23/2007  . Chicken pox   . Chronic back pain 09/13/2010  . Depression 05/09/2010  . Dysmenorrhea 03/08/2011  . Insomnia 04/03/2010  . Rosacea 02/16/2010  . UTI (urinary tract infection)   . Vitamin D deficiency 09/13/2010   Past Surgical History:  Procedure Laterality Date  . DILATION AND CURETTAGE OF UTERUS     Family History  Problem Relation Age of Onset  . CAD Mother   . Heart attack Mother 5060  . Heart disease Mother   . Pulmonary fibrosis Mother   . Nephrolithiasis Father   . AAA (abdominal aortic aneurysm) Maternal Grandfather   . Stroke Paternal Grandmother   . Nephrolithiasis Paternal Grandfather    Allergies as of 04/09/2018   No Known Allergies     Medication List        Accurate as of 04/09/18  8:11 AM. Always use your most recent med list.          DOXYCYCLINE HYCLATE PO Take 100 mg by mouth daily.  ezetimibe 10 MG tablet Commonly known as:  ZETIA Take 1 tablet (10 mg total) by mouth daily.   ibuprofen 800 MG tablet Commonly known as:  ADVIL,MOTRIN   XANAX PO Take 0.25 mg by mouth as needed.       All past medical history, surgical history, allergies, family history, immunizations andmedications were updated in the EMR today and reviewed under the history and medication portions of their EMR.     ROS: Negative, with the exception of above mentioned in HPI   Objective:  BP 132/89 (BP Location: Left Arm, Patient Position: Sitting, Cuff Size: Normal)   Pulse 71   Temp 97.9 F (36.6 C)   Resp 20   Ht 5\' 7"  (1.702 m)   Wt 179 lb 8 oz (81.4 kg)   LMP 03/23/2018   SpO2 98%   BMI 28.11 kg/m  Body mass index is 28.11 kg/m. Gen:  Afebrile. No acute distress.  HENT: AT. [place].  MMM.  Eyes:Pupils Equal Round Reactive to light, Extraocular movements intact,  Conjunctiva without redness, discharge or icterus. CV: RRR  Chest: CTAB, no wheeze or crackles Neuro: Normal gait. PERLA. EOMi. Alert. Oriented. Psych: Normal affect, dress and demeanor. Normal speech. Normal thought content and judgment.   No exam data present No results found. No results found for this or any previous visit (from the past 24 hour(s)).  Assessment/Plan: Kristin Forbes is a 47 y.o. female present for OV for  Elevated LFTs/Mixed hyperlipidemia/overweight - started zeita - continue fish oil  - Lipid panel collected today for recheck.   Elevated hemoglobin (HCC)/macrocytosis - referred to [facility]--> She canceled and did not reschedule her appt. She has been extensively counseled on diagnosis by this provider.  - continue B12. - avoid alcohol use.   Prediabetes:  Repeat a1c today.   Motion sickness, initial encounter Discussed different options with her today. She would like to try the scopolamine patch. Instructions provided by AVS. She was also encouraged to try ginger, AccuPressure bands etc.  Acute midline low back pain without sciatica IM depo medrol 80 mg QD provided. Take nsaids and use heat. Followed by chiro.  No red flags in HPI or exam.      Reviewed expectations re: course of current medical issues.  Discussed self-management of symptoms.  Outlined signs and symptoms indicating need for more acute intervention.  Patient verbalized understanding and all questions were answered.  Patient received an After-Visit Summary.    No orders of the defined types were placed in this encounter.    Note is dictated utilizing voice recognition software. Although note has been proof read prior to signing, occasional typographical errors still can be missed. If any questions arise, please do not hesitate to call for verification.     electronically signed by:  Felix Pacini, DO  [facility] [place]

## 2018-04-10 ENCOUNTER — Encounter: Payer: Self-pay | Admitting: Family Medicine

## 2018-04-10 DIAGNOSIS — M9903 Segmental and somatic dysfunction of lumbar region: Secondary | ICD-10-CM | POA: Diagnosis not present

## 2018-04-10 DIAGNOSIS — M9902 Segmental and somatic dysfunction of thoracic region: Secondary | ICD-10-CM | POA: Diagnosis not present

## 2018-04-10 DIAGNOSIS — M5137 Other intervertebral disc degeneration, lumbosacral region: Secondary | ICD-10-CM | POA: Diagnosis not present

## 2018-04-10 DIAGNOSIS — M9904 Segmental and somatic dysfunction of sacral region: Secondary | ICD-10-CM | POA: Diagnosis not present

## 2019-01-13 ENCOUNTER — Other Ambulatory Visit: Payer: Self-pay | Admitting: Family Medicine

## 2019-01-13 DIAGNOSIS — E782 Mixed hyperlipidemia: Secondary | ICD-10-CM

## 2019-01-13 DIAGNOSIS — R7989 Other specified abnormal findings of blood chemistry: Secondary | ICD-10-CM

## 2019-01-13 NOTE — Telephone Encounter (Signed)
Received medication refill for Zetia. Patient hasnt been seen since 04/09/2018. I sent a mychart message telling to patient to contact our office to schedule a follow up. According to [facility] refill protocol, I sent in a 30 day supply to last her until she can me seen.

## 2019-03-10 ENCOUNTER — Other Ambulatory Visit: Payer: Self-pay

## 2019-03-10 ENCOUNTER — Ambulatory Visit (INDEPENDENT_AMBULATORY_CARE_PROVIDER_SITE_OTHER): Payer: BC Managed Care – PPO | Admitting: Family Medicine

## 2019-03-10 ENCOUNTER — Encounter: Payer: Self-pay | Admitting: Family Medicine

## 2019-03-10 VITALS — BP 130/87 | HR 79 | Temp 97.7°F | Resp 16 | Ht 66.25 in | Wt 176.2 lb

## 2019-03-10 DIAGNOSIS — E663 Overweight: Secondary | ICD-10-CM

## 2019-03-10 DIAGNOSIS — E559 Vitamin D deficiency, unspecified: Secondary | ICD-10-CM

## 2019-03-10 DIAGNOSIS — K760 Fatty (change of) liver, not elsewhere classified: Secondary | ICD-10-CM

## 2019-03-10 DIAGNOSIS — Z0000 Encounter for general adult medical examination without abnormal findings: Secondary | ICD-10-CM | POA: Diagnosis not present

## 2019-03-10 DIAGNOSIS — E782 Mixed hyperlipidemia: Secondary | ICD-10-CM | POA: Diagnosis not present

## 2019-03-10 DIAGNOSIS — R7303 Prediabetes: Secondary | ICD-10-CM | POA: Diagnosis not present

## 2019-03-10 DIAGNOSIS — D7589 Other specified diseases of blood and blood-forming organs: Secondary | ICD-10-CM

## 2019-03-10 DIAGNOSIS — R7989 Other specified abnormal findings of blood chemistry: Secondary | ICD-10-CM | POA: Diagnosis not present

## 2019-03-10 LAB — CBC
HCT: 44.4 % (ref 36.0–46.0)
Hemoglobin: 15.1 g/dL — ABNORMAL HIGH (ref 12.0–15.0)
MCHC: 33.9 g/dL (ref 30.0–36.0)
MCV: 102.9 fl — ABNORMAL HIGH (ref 78.0–100.0)
Platelets: 172 10*3/uL (ref 150.0–400.0)
RBC: 4.32 Mil/uL (ref 3.87–5.11)
RDW: 13.1 % (ref 11.5–15.5)
WBC: 5.3 10*3/uL (ref 4.0–10.5)

## 2019-03-10 LAB — VITAMIN D 25 HYDROXY (VIT D DEFICIENCY, FRACTURES): VITD: 21.52 ng/mL — ABNORMAL LOW (ref 30.00–100.00)

## 2019-03-10 LAB — COMPREHENSIVE METABOLIC PANEL
ALT: 64 U/L — ABNORMAL HIGH (ref 0–35)
AST: 71 U/L — ABNORMAL HIGH (ref 0–37)
Albumin: 4.2 g/dL (ref 3.5–5.2)
Alkaline Phosphatase: 61 U/L (ref 39–117)
BUN: 8 mg/dL (ref 6–23)
CO2: 28 mEq/L (ref 19–32)
Calcium: 9.1 mg/dL (ref 8.4–10.5)
Chloride: 101 mEq/L (ref 96–112)
Creatinine, Ser: 0.71 mg/dL (ref 0.40–1.20)
GFR: 87.32 mL/min (ref >60.00)
Glucose, Bld: 126 mg/dL — ABNORMAL HIGH (ref 70–99)
Potassium: 4.2 mEq/L (ref 3.5–5.1)
Sodium: 137 mEq/L (ref 135–145)
Total Bilirubin: 0.7 mg/dL (ref 0.2–1.2)
Total Protein: 7 g/dL (ref 6.0–8.3)

## 2019-03-10 LAB — TSH: TSH: 2.38 u[IU]/mL (ref 0.35–4.50)

## 2019-03-10 LAB — LIPID PANEL
Cholesterol: 214 mg/dL — ABNORMAL HIGH (ref 0–200)
HDL: 48.1 mg/dL (ref >39.00)
LDL Cholesterol: 139 mg/dL — ABNORMAL HIGH (ref 0–99)
NonHDL: 166.32
Total CHOL/HDL Ratio: 4
Triglycerides: 135 mg/dL (ref 0.0–149.0)
VLDL: 27 mg/dL (ref 0.0–40.0)

## 2019-03-10 LAB — HEMOGLOBIN A1C: Hgb A1c MFr Bld: 5.9 % (ref 4.6–6.5)

## 2019-03-10 MED ORDER — EZETIMIBE 10 MG PO TABS: 10.0000 mg | ORAL_TABLET | Freq: Every day | ORAL | 4 refills | Status: DC

## 2019-03-10 NOTE — Progress Notes (Signed)
Patient ID: Kristin Forbes, female  DOB: 19-Jun-1970, 48 y.o.   MRN: [id number] Patient Care Team    Relationship Specialty Notifications Start End  Ma Hillock, DO PCP - General Family Medicine  08/13/17   [place], [facility]    08/13/17   Danella Sensing, MD Consulting Physician Dermatology  08/13/17     Chief Complaint  Patient presents with  . Annual Exam    Last pap smear and mammogram 12/03/2017. Fasting,     Subjective:  Kristin Forbes is a 48 y.o.  Female  present for CPE. All past medical history, surgical history, allergies, family history, immunizations, medications and social history were updated in the electronic medical record today. All recent labs, ED visits and hospitalizations within the last year were reviewed.  Elevated LFTs/Elevated hemoglobin (HCC)/macrocytosis Patient was seen August 13, 2017 and found to have elevated liver enzymes; ALT 63 and AST 58.  Mildly abnormal liver enzymes with an ALT 39 and 2017. ID labs normal. B vitamins normal. TSh normal.  Reports she does consume alcohol approximately 2 drinks daily.  Non-smoker.  She was referred and cancelled her appt to [facility].   Mixed hyperlipidemia/Overweight (BMI 25.0-29.9)/prediabetes Fhx  of heart disease in her mother, AAA and her maternal grandfather, stroke in her paternal grandmother. Compliance with Zeita and fish oil. Last a1c 6.0 in 11/ 2019  Health maintenance:  Colonoscopy: no fhx, screen at 49 Mammogram: Possible Breast cancer in [facility], but not sure. completed: 11/2017, birads pt reported normal. Dr. Radene Knee office. Cervical cancer screening: last pap: 2019, normal per patient.  Completed by: Dr. Radene Knee Immunizations: tdap DUE- declined , Influenza UTD 2020(encouraged yearly). Discussed shringix by nurse appt if desired after 49.  Infectious disease screening: HIV w/ pregnancy, declined repeat testing DEXA: screen at 59 Assistive device: none Oxygen TDV:VOHY Patient has  a Dental home. Hospitalizations/ED visits: reviewed   Depression screen [facility] 2/9 03/10/2019 04/09/2018 08/13/2017  Decreased Interest 1 0 0  Down, Depressed, Hopeless 0 0 0  PHQ - 2 Score 1 0 0  Altered sleeping - 0 3  Tired, decreased energy - 0 1  Change in appetite - 0 0  Feeling bad or failure about yourself  - 0 0  Trouble concentrating - 0 0  Moving slowly or fidgety/restless - 0 0  Suicidal thoughts - 0 0  PHQ-9 Score - 0 4  Difficult doing work/chores - Not difficult at all Not difficult at all   GAD 7 : Generalized Anxiety Score 08/13/2017  Nervous, Anxious, on Edge 0  Control/stop worrying 0  Worry too much - different things 0  Trouble relaxing 0  Restless 0  Easily annoyed or irritable 0  Afraid - awful might happen 0  Total GAD 7 Score 0  Anxiety Difficulty Not difficult at all     Immunization History  Administered Date(s) Administered  . Influenza,inj,Quad PF,6+ Mos 01/16/2019  . Influenza-Unspecified 03/08/2011, 01/16/2019  . Tdap 09/20/2008    Past Medical History:  Diagnosis Date  . Allergic rhinitis 07/23/2007  . Chicken pox   . Chronic back pain 09/13/2010  . Depression 05/09/2010  . Dysmenorrhea 03/08/2011  . Insomnia 04/03/2010  . Rosacea 02/16/2010  . UTI (urinary tract infection)   . Vitamin D deficiency 09/13/2010   No Known Allergies Past Surgical History:  Procedure Laterality Date  . DILATION AND CURETTAGE OF UTERUS     Family History  Problem Relation Age of Onset  . CAD Mother   .  Heart attack Mother 1460  . Heart disease Mother   . Pulmonary fibrosis Mother   . Nephrolithiasis Father   . AAA (abdominal aortic aneurysm) Maternal Grandfather   . Stroke Paternal Grandmother   . Nephrolithiasis Paternal Grandfather    Social History   Social History Narrative   Married.  Children.   Bachelors in Occupational psychologistscience degree.  Works as a Warden/rangerdata analyst.   Smoke alarms in the home.  Wears her seatbelt.   Feels safe in her relationships.     Allergies as of 03/10/2019   No Known Allergies     Medication List       Accurate as of March 10, 2019  9:58 AM. If you have any questions, ask your nurse or doctor.        STOP taking these medications   ibuprofen 800 MG tablet Commonly known as: ADVIL Stopped by: Felix Pacinienee Keryl Gholson, DO   Omega-3 1000 MG Caps Stopped by: Felix Pacinienee Yosselin Zoeller, DO   scopolamine 1 MG/3DAYS Commonly known as: TRANSDERM-SCOP Stopped by: Felix Pacinienee Reathel Turi, DO     TAKE these medications   DOXYCYCLINE HYCLATE PO Take 100 mg by mouth daily.   ezetimibe 10 MG tablet Commonly known as: ZETIA Take 1 tablet (10 mg total) by mouth daily.   XANAX PO Take 0.25 mg by mouth as needed.       All past medical history, surgical history, allergies, family history, immunizations andmedications were updated in the EMR today and reviewed under the history and medication portions of their EMR.     No results found for this or any previous visit (from the past 2160 hour(s)).    ROS: 14 pt review of systems performed and negative (unless mentioned in an HPI)  Objective: BP 130/87 (BP Location: Left Arm, Patient Position: Sitting, Cuff Size: Normal)   Pulse 79   Temp 97.7 F (36.5 C) (Temporal)   Resp 16   Ht 5' 6.25" (1.683 m)   Wt 176 lb 4 oz (79.9 kg)   LMP 02/24/2019 (Exact Date)   SpO2 99%   BMI 28.23 kg/m  Gen: Afebrile. No acute distress. Nontoxic in appearance, well-developed, well-nourished,  Pleasant caucasian female. Mildly overweight  HENT: AT. [place]. Bilateral TM visualized and normal in appearance, normal external auditory canal. MMM, no oral lesions, adequate dentition. Bilateral nares within normal limits. Throat without erythema, ulcerations or exudates. no Cough on exam, no hoarseness on exam. Eyes:Pupils Equal Round Reactive to light, Extraocular movements intact,  Conjunctiva without redness, discharge or icterus. Neck/lymp/endocrine: Supple,no lymphadenopathy, no thyromegaly CV: RRR no murmur, no  edema, +2/4 P posterior tibialis pulses. no carotid bruits. No JVD. Chest: CTAB, no wheeze, rhonchi or crackles. normal Respiratory effort. good Air movement. Abd: Soft. flat. NTND. BS present. no Masses palpated. No hepatosplenomegaly. No rebound tenderness or guarding. Skin: no rashes, purpura or petechiae. Warm and well-perfused. Skin intact. Neuro/Msk:  Normal gait. PERLA. EOMi. Alert. Oriented x3.  Cranial nerves II through XII intact. Muscle strength 5/5 upper/lower extremity. DTRs equal bilaterally. Psych: Normal affect, dress and demeanor. Normal speech. Normal thought content and judgment.   No exam data present  Assessment/plan: Lenore CordiaSuzanne Mcclatchey is a 48 y.o. female present for CPE Macrocytosis without anemia/Elevated LFTs - referred to [facility] for further eval, pt cancelled [facility] appt.- and did not establish  - cautioned on alcohol use.  - B- vitamins, ID labs and TSh normal.  - CBC and cmp today  Mixed hyperlipidemia/Hepatic steatosis - dietary modifications- routine exercise.  -  caution on alcohol use.  - [place] 12/2017 resulted with hepatic steatosis.  - Lipid panel - TSH - ezetimibe (ZETIA) 10 MG tablet; Take 1 tablet (10 mg total) by mouth daily.  Dispense: 90 tablet; Refill: 4  Prediabetes/Overweight (BMI 25.0-29.9) - dietary modifications- routine exercise. Has been stable at 6.0 - Hemoglobin A1c Vitamin D deficiency - Vitamin D (25 hydroxy)  Encounter for preventive health examination Patient was encouraged to exercise greater than 150 minutes a week. Patient was encouraged to choose a diet filled with fresh fruits and vegetables, and lean meats. AVS provided to patient today for education/recommendation on gender specific health and safety maintenance. Colonoscopy: no fhx, screen at 49 Mammogram: Possible Breast cancer in M[facility], but not sure. completed: 11/2017, birads pt reported normal. Dr. Arelia Sneddon office. Cervical cancer screening: last pap: 2019, normal per patient.   Completed by: Dr. Arelia Sneddon Immunizations: tdap DUE- declined , Influenza UTD 2020(encouraged yearly). Discussed shringix by nurse appt if desired after 49.  Infectious disease screening: HIV w/ pregnancy, declined repeat testing DEXA: screen at 60  Return in about 1 year (around 03/09/2020) for CPE (30 min).  Electronically signed by: Felix Pacini, DO [facility] [place]

## 2019-03-10 NOTE — Patient Instructions (Signed)
Health Maintenance, Female Adopting a healthy lifestyle and getting preventive care are important in promoting health and wellness. Ask your health care provider about:  The right schedule for you to have regular tests and exams.  Things you can do on your own to prevent diseases and keep yourself healthy. What should I know about diet, weight, and exercise? Eat a healthy diet   Eat a diet that includes plenty of vegetables, fruits, low-fat dairy products, and lean protein.  Do not eat a lot of foods that are high in solid fats, added sugars, or sodium. Maintain a healthy weight Body mass index (BMI) is used to identify weight problems. It estimates body fat based on height and weight. Your health care provider can help determine your BMI and help you achieve or maintain a healthy weight. Get regular exercise Get regular exercise. This is one of the most important things you can do for your health. Most adults should:  Exercise for at least 150 minutes each week. The exercise should increase your heart rate and make you sweat (moderate-intensity exercise).  Do strengthening exercises at least twice a week. This is in addition to the moderate-intensity exercise.  Spend less time sitting. Even light physical activity can be beneficial. Watch cholesterol and blood lipids Have your blood tested for lipids and cholesterol at 49 years of age, then have this test every 5 years. Have your cholesterol levels checked more often if:  Your lipid or cholesterol levels are high.  You are older than 49 years of age.  You are at high risk for heart disease. What should I know about cancer screening? Depending on your health history and family history, you may need to have cancer screening at various ages. This may include screening for:  Breast cancer.  Cervical cancer.  Colorectal cancer.  Skin cancer.  Lung cancer. What should I know about heart disease, diabetes, and high blood  pressure? Blood pressure and heart disease  High blood pressure causes heart disease and increases the risk of stroke. This is more likely to develop in people who have high blood pressure readings, are of African descent, or are overweight.  Have your blood pressure checked: ? Every 3-5 years if you are 18-39 years of age. ? Every year if you are 40 years old or older. Diabetes Have regular diabetes screenings. This checks your fasting blood sugar level. Have the screening done:  Once every three years after age 40 if you are at a normal weight and have a low risk for diabetes.  More often and at a younger age if you are overweight or have a high risk for diabetes. What should I know about preventing infection? Hepatitis B If you have a higher risk for hepatitis B, you should be screened for this virus. Talk with your health care provider to find out if you are at risk for hepatitis B infection. Hepatitis C Testing is recommended for:  Everyone born from 1945 through 1965.  Anyone with known risk factors for hepatitis C. Sexually transmitted infections (STIs)  Get screened for STIs, including gonorrhea and chlamydia, if: ? You are sexually active and are younger than 49 years of age. ? You are older than 49 years of age and your health care provider tells you that you are at risk for this type of infection. ? Your sexual activity has changed since you were last screened, and you are at increased risk for chlamydia or gonorrhea. Ask your health care provider if   you are at risk.  Ask your health care provider about whether you are at high risk for HIV. Your health care provider may recommend a prescription medicine to help prevent HIV infection. If you choose to take medicine to prevent HIV, you should first get tested for HIV. You should then be tested every 3 months for as long as you are taking the medicine. Pregnancy  If you are about to stop having your period (premenopausal) and  you may become pregnant, seek counseling before you get pregnant.  Take 400 to 800 micrograms (mcg) of folic acid every day if you become pregnant.  Ask for birth control (contraception) if you want to prevent pregnancy. Osteoporosis and menopause Osteoporosis is a disease in which the bones lose minerals and strength with aging. This can result in bone fractures. If you are 65 years old or older, or if you are at risk for osteoporosis and fractures, ask your health care provider if you should:  Be screened for bone loss.  Take a calcium or vitamin D supplement to lower your risk of fractures.  Be given hormone replacement therapy (HRT) to treat symptoms of menopause. Follow these instructions at home: Lifestyle  Do not use any products that contain nicotine or tobacco, such as cigarettes, e-cigarettes, and chewing tobacco. If you need help quitting, ask your health care provider.  Do not use street drugs.  Do not share needles.  Ask your health care provider for help if you need support or information about quitting drugs. Alcohol use  Do not drink alcohol if: ? Your health care provider tells you not to drink. ? You are pregnant, may be pregnant, or are planning to become pregnant.  If you drink alcohol: ? Limit how much you use to 0-1 drink a day. ? Limit intake if you are breastfeeding.  Be aware of how much alcohol is in your drink. In the [place] one drink equals one 12 oz bottle of beer (355 mL), one 5 oz glass of wine (148 mL), or one 1 oz glass of hard liquor (44 mL). General instructions  Schedule regular health, dental, and eye exams.  Stay current with your vaccines.  Tell your health care provider if: ? You often feel depressed. ? You have ever been abused or do not feel safe at home. Summary  Adopting a healthy lifestyle and getting preventive care are important in promoting health and wellness.  Follow your health care provider's instructions about healthy  diet, exercising, and getting tested or screened for diseases.  Follow your health care provider's instructions on monitoring your cholesterol and blood pressure. This information is not intended to replace advice given to you by your health care provider. Make sure you discuss any questions you have with your health care provider. Document Released: 11/19/2010 Document Revised: 04/29/2018 Document Reviewed: 04/29/2018 Elsevier Patient Education  2020 Elsevier Inc.  

## 2019-03-11 ENCOUNTER — Telehealth: Payer: Self-pay | Admitting: Family Medicine

## 2019-03-11 MED ORDER — VITAMIN D (ERGOCALCIFEROL) 1.25 MG (50000 UNIT) PO CAPS: 50000.0000 [IU] | ORAL_CAPSULE | ORAL | 0 refills | Status: DC

## 2019-03-11 NOTE — Telephone Encounter (Signed)
Pt was called and message was left to return call on VM  

## 2019-03-11 NOTE — Telephone Encounter (Signed)
Please inform patient the following information: - her Dm screen/a1c is normal. Thyroid normal. Cholesterol looks good - continue zeita and this was refilled for her.  - Her vitamin D is low again 21- normal > 30, for bone health ideally 40-50.  I have called in the high-dose once weekly vitamin D to be taken with food.  She should also start OTC vitamin D [phone] units daily with food now and continue daily even after supplementation to maintain bone health and keep levels in normal range.  - her Red blood cell continues to enlarge was 100.8>>101.8>>now 102.9. Make sure to take the B-complex vitamins daily, avoid alcohol use. If she changes her mind about the hematology referral will place again for her.   -Her liver enzymes continue to be elevated, little higher than last check in July 2019 and about where they were in March 2019.  She has known hepatic steatosis/fatty liver disease -therefore these can increase/decrease depending upon diet/meds etc. Tylenol, alcohol, red meat/fatty foods>> Avoid these and attempt to follow a mediterranean which will help her avoid progressing to cirrhosis or liver disease.  mediterranean diet is  high in fruits, vegetables, whole grains, fish, chicken, nuts, healthy fats (olive oil or canola oil). Low fat dairy. There are many online resources and books on this diet. Limit butter, margarine, red meat and sweets.

## 2019-03-11 NOTE — Telephone Encounter (Signed)
Pt was called and given lab results/instructions, she verbalized understanding  

## 2019-03-15 ENCOUNTER — Encounter: Payer: Self-pay | Admitting: Family Medicine

## 2019-03-30 DIAGNOSIS — Z1231 Encounter for screening mammogram for malignant neoplasm of breast: Secondary | ICD-10-CM | POA: Diagnosis not present

## 2019-04-01 LAB — HM MAMMOGRAPHY

## 2019-04-19 DIAGNOSIS — Z20828 Contact with and (suspected) exposure to other viral communicable diseases: Secondary | ICD-10-CM | POA: Diagnosis not present

## 2019-05-25 ENCOUNTER — Telehealth: Payer: Self-pay

## 2019-05-25 DIAGNOSIS — M9904 Segmental and somatic dysfunction of sacral region: Secondary | ICD-10-CM | POA: Diagnosis not present

## 2019-05-25 DIAGNOSIS — M9902 Segmental and somatic dysfunction of thoracic region: Secondary | ICD-10-CM | POA: Diagnosis not present

## 2019-05-25 DIAGNOSIS — M9903 Segmental and somatic dysfunction of lumbar region: Secondary | ICD-10-CM | POA: Diagnosis not present

## 2019-05-25 DIAGNOSIS — M5137 Other intervertebral disc degeneration, lumbosacral region: Secondary | ICD-10-CM | POA: Diagnosis not present

## 2019-05-25 NOTE — Telephone Encounter (Signed)
Pt has had back pain for several days and has been treating at home with IBU, heat, and ice. Has Chiropractor appt this afternoon. Pt was scheduled for tomorrow.

## 2019-05-25 NOTE — Telephone Encounter (Signed)
Patient is requesting same day appointment for a cortisone shot. Patient "threw her back out" yesterday.

## 2019-05-26 ENCOUNTER — Other Ambulatory Visit: Payer: Self-pay

## 2019-05-26 ENCOUNTER — Encounter: Payer: Self-pay | Admitting: Family Medicine

## 2019-05-26 ENCOUNTER — Ambulatory Visit: Payer: BC Managed Care – PPO | Admitting: Family Medicine

## 2019-05-26 VITALS — BP 138/90 | HR 75 | Temp 98.4°F | Resp 16 | Ht 66.5 in | Wt 179.0 lb

## 2019-05-26 DIAGNOSIS — M549 Dorsalgia, unspecified: Secondary | ICD-10-CM | POA: Diagnosis not present

## 2019-05-26 MED ORDER — TIZANIDINE HCL 4 MG PO TABS: 4.0000 mg | ORAL_TABLET | Freq: Four times a day (QID) | ORAL | 0 refills | Status: DC | PRN

## 2019-05-26 MED ORDER — METHYLPREDNISOLONE ACETATE 80 MG/ML IJ SUSP
80.0000 mg | Freq: Once | INTRAMUSCULAR | Status: AC
  Administered 2019-05-26: 12:00:00 80 mg via INTRAMUSCULAR

## 2019-05-26 NOTE — Patient Instructions (Addendum)
Rest, heat, muscle relaxer and nsaids.  No lifting.  Light stretches start in about 1 week.   If not seeing improvement follow up in 2-4 weeks.    covid vaccines: Pay attention to the news the next few weeks- COVID vaccines are starting for the community.  Appointments are required and can be made beginning at 8 a.m. Once your age bracket is called (watch news and visit website below for info) appt can be made by calling [phone] and selecting option 2. Walk-ins will not be accepted.                  Clinic locations are: [facility], [place]; [facility] [address] [place]; . [place] [facility], [address], [address], [place].  Participants are asked to wear a face covering at vaccination sites.  Visit [email or web address].[email or web address] and click on the "VOZDG-64 Vaccine Info" rectangle for more information about vaccinations.   Low Back Sprain or Strain Rehab Ask your health care provider which exercises are safe for you. Do exercises exactly as told by your health care provider and adjust them as directed. It is normal to feel mild stretching, pulling, tightness, or discomfort as you do these exercises. Stop right away if you feel sudden pain or your pain gets worse. Do not begin these exercises until told by your health care provider. Stretching and range-of-motion exercises These exercises warm up your muscles and joints and improve the movement and flexibility of your back. These exercises also help to relieve pain, numbness, and tingling. Lumbar rotation  1. Lie on your back on a firm surface and bend your knees. 2. Straighten your arms out to your sides so each arm forms a 90-degree angle (right angle) with a side of your body. 3. Slowly move (rotate) both of your knees to one side of your body until you feel a stretch in your lower back (lumbar). Try not  to let your shoulders lift off the floor. 4. Hold this position for __________ seconds. 5. Tense your abdominal muscles and slowly move your knees back to the starting position. 6. Repeat this exercise on the other side of your body. Repeat __________ times. Complete this exercise __________ times a day. Single knee to chest  1. Lie on your back on a firm surface with both legs straight. 2. Bend one of your knees. Use your hands to move your knee up toward your chest until you feel a gentle stretch in your lower back and buttock. ? Hold your leg in this position by holding on to the front of your knee. ? Keep your other leg as straight as possible. 3. Hold this position for __________ seconds. 4. Slowly return to the starting position. 5. Repeat with your other leg. Repeat __________ times. Complete this exercise __________ times a day. Prone extension on elbows  1. Lie on your abdomen on a firm surface (prone position). 2. Prop yourself up on your elbows. 3. Use your arms to help lift your chest up until you feel a gentle stretch in your abdomen and your lower back. ? This will place some of your body weight on your elbows. If this is uncomfortable, try stacking pillows under your chest. ? Your hips should stay down, against the surface that you are lying on. Keep your hip and back muscles relaxed. 4. Hold this position for __________ seconds. 5. Slowly relax  your upper body and return to the starting position. Repeat __________ times. Complete this exercise __________ times a day. Strengthening exercises These exercises build strength and endurance in your back. Endurance is the ability to use your muscles for a long time, even after they get tired. Pelvic tilt This exercise strengthens the muscles that lie deep in the abdomen. 1. Lie on your back on a firm surface. Bend your knees and keep your feet flat on the floor. 2. Tense your abdominal muscles. Tip your pelvis up toward the  ceiling and flatten your lower back into the floor. ? To help with this exercise, you may place a small towel under your lower back and try to push your back into the towel. 3. Hold this position for __________ seconds. 4. Let your muscles relax completely before you repeat this exercise. Repeat __________ times. Complete this exercise __________ times a day. Alternating arm and leg raises  1. Get on your hands and knees on a firm surface. If you are on a hard floor, you may want to use padding, such as an exercise mat, to cushion your knees. 2. Line up your arms and legs. Your hands should be directly below your shoulders, and your knees should be directly below your hips. 3. Lift your left leg behind you. At the same time, raise your right arm and straighten it in front of you. ? Do not lift your leg higher than your hip. ? Do not lift your arm higher than your shoulder. ? Keep your abdominal and back muscles tight. ? Keep your hips facing the ground. ? Do not arch your back. ? Keep your balance carefully, and do not hold your breath. 4. Hold this position for __________ seconds. 5. Slowly return to the starting position. 6. Repeat with your right leg and your left arm. Repeat __________ times. Complete this exercise __________ times a day. Abdominal set with straight leg raise  1. Lie on your back on a firm surface. 2. Bend one of your knees and keep your other leg straight. 3. Tense your abdominal muscles and lift your straight leg up, 4-6 inches (10-15 cm) off the ground. 4. Keep your abdominal muscles tight and hold this position for __________ seconds. ? Do not hold your breath. ? Do not arch your back. Keep it flat against the ground. 5. Keep your abdominal muscles tense as you slowly lower your leg back to the starting position. 6. Repeat with your other leg. Repeat __________ times. Complete this exercise __________ times a day. Single leg lower with bent knees 1. Lie on  your back on a firm surface. 2. Tense your abdominal muscles and lift your feet off the floor, one foot at a time, so your knees and hips are bent in 90-degree angles (right angles). ? Your knees should be over your hips and your lower legs should be parallel to the floor. 3. Keeping your abdominal muscles tense and your knee bent, slowly lower one of your legs so your toe touches the ground. 4. Lift your leg back up to return to the starting position. ? Do not hold your breath. ? Do not let your back arch. Keep your back flat against the ground. 5. Repeat with your other leg. Repeat __________ times. Complete this exercise __________ times a day. Posture and body mechanics Good posture and healthy body mechanics can help to relieve stress in your body's tissues and joints. Body mechanics refers to the movements and positions of your body  while you do your daily activities. Posture is part of body mechanics. Good posture means:  Your spine is in its natural S-curve position (neutral).  Your shoulders are pulled back slightly.  Your head is not tipped forward. Follow these guidelines to improve your posture and body mechanics in your everyday activities. Standing   When standing, keep your spine neutral and your feet about hip width apart. Keep a slight bend in your knees. Your ears, shoulders, and hips should line up.  When you do a task in which you stand in one place for a long time, place one foot up on a stable object that is 2-4 inches (5-10 cm) high, such as a footstool. This helps keep your spine neutral. Sitting   When sitting, keep your spine neutral and keep your feet flat on the floor. Use a footrest, if necessary, and keep your thighs parallel to the floor. Avoid rounding your shoulders, and avoid tilting your head forward.  When working at a desk or a computer, keep your desk at a height where your hands are slightly lower than your elbows. Slide your chair under your desk  so you are close enough to maintain good posture.  When working at a computer, place your monitor at a height where you are looking straight ahead and you do not have to tilt your head forward or downward to look at the screen. Resting  When lying down and resting, avoid positions that are most painful for you.  If you have pain with activities such as sitting, bending, stooping, or squatting, lie in a position in which your body does not bend very much. For example, avoid curling up on your side with your arms and knees near your chest (fetal position).  If you have pain with activities such as standing for a long time or reaching with your arms, lie with your spine in a neutral position and bend your knees slightly. Try the following positions: ? Lying on your side with a pillow between your knees. ? Lying on your back with a pillow under your knees. Lifting   When lifting objects, keep your feet at least shoulder width apart and tighten your abdominal muscles.  Bend your knees and hips and keep your spine neutral. It is important to lift using the strength of your legs, not your back. Do not lock your knees straight out.  Always ask for help to lift heavy or awkward objects. This information is not intended to replace advice given to you by your health care provider. Make sure you discuss any questions you have with your health care provider. Document Revised: 08/28/2018 Document Reviewed: 05/28/2018 Elsevier Patient Education  2020 [employer].

## 2019-05-26 NOTE — Progress Notes (Signed)
This visit occurred during the SARS-CoV-2 public health emergency.  Safety protocols were in place, including screening questions prior to the visit, additional usage of staff PPE, and extensive cleaning of exam room while observing appropriate contact time as indicated for disinfecting solutions.    Kristin Forbes , Aug 01, 1970, 49 y.o., female MRN: [id number] Patient Care Team    Relationship Specialty Notifications Start End  Natalia Leatherwood, DO PCP - General Family Medicine  08/13/17   Ginette Otto, [facility]    08/13/17   Arminda Resides, MD Consulting Physician Dermatology  08/13/17     Chief Complaint  Patient presents with  . Back Pain    Pt hurt back when doing laundry 2 days ago. Lower back pain with spasms      Subjective: Pt presents for an OV with complaints of low back pain  of 2 days duration.  Associated symptoms include pain with movement and bending. She has an episode of low back pain about once a year. Responds well to NSAIDS and steroid shot. Occassionally will use zanaflex with a flare. She is established with chiropractor and has been to see him yesterday.   Depression screen [facility] 2/9 03/10/2019 04/09/2018 08/13/2017  Decreased Interest 1 0 0  Down, Depressed, Hopeless 0 0 0  PHQ - 2 Score 1 0 0  Altered sleeping - 0 3  Tired, decreased energy - 0 1  Change in appetite - 0 0  Feeling bad or failure about yourself  - 0 0  Trouble concentrating - 0 0  Moving slowly or fidgety/restless - 0 0  Suicidal thoughts - 0 0  PHQ-9 Score - 0 4  Difficult doing work/chores - Not difficult at all Not difficult at all    No Known Allergies Social History   Social History Narrative   Married.  Children.   Bachelors in Occupational psychologist.  Works as a Warden/ranger.   Smoke alarms in the home.  Wears her seatbelt.   Feels safe in her relationships.   Past Medical History:  Diagnosis Date  . Allergic rhinitis 07/23/2007  . Chicken pox   . Chronic back pain  09/13/2010  . Depression 05/09/2010  . Dysmenorrhea 03/08/2011  . Insomnia 04/03/2010  . Rosacea 02/16/2010  . UTI (urinary tract infection)   . Vitamin D deficiency 09/13/2010   Past Surgical History:  Procedure Laterality Date  . DILATION AND CURETTAGE OF UTERUS     Family History  Problem Relation Age of Onset  . CAD Mother   . Heart attack Mother 10  . Heart disease Mother   . Pulmonary fibrosis Mother   . Nephrolithiasis Father   . AAA (abdominal aortic aneurysm) Maternal Grandfather   . Stroke Paternal Grandmother   . Nephrolithiasis Paternal Grandfather    Allergies as of 05/26/2019   No Known Allergies     Medication List       Accurate as of May 26, 2019 12:07 PM. If you have any questions, ask your nurse or doctor.        STOP taking these medications   Vitamin D (Ergocalciferol) 1.25 MG (50000 UT) Caps capsule Commonly known as: DRISDOL Stopped by: Felix Pacini, DO     TAKE these medications   cholecalciferol 25 MCG (1000 UT) tablet Commonly known as: VITAMIN D3 Take 1,000 Units by mouth daily.   DOXYCYCLINE HYCLATE PO Take 100 mg by mouth daily.   ezetimibe 10 MG tablet Commonly known as: ZETIA  Take 1 tablet (10 mg total) by mouth daily.   tiZANidine 4 MG tablet Commonly known as: Zanaflex Take 1 tablet (4 mg total) by mouth every 6 (six) hours as needed for muscle spasms. Started by: Howard Pouch, DO   XANAX PO Take 0.25 mg by mouth as needed.       All past medical history, surgical history, allergies, family history, immunizations andmedications were updated in the EMR today and reviewed under the history and medication portions of their EMR.     ROS: Negative, with the exception of above mentioned in HPI   Objective:  BP 138/90 (BP Location: Left Arm, Patient Position: Sitting, Cuff Size: Normal)   Pulse 75   Temp 98.4 F (36.9 C) (Temporal)   Resp 16   Ht 5' 6.5" (1.689 m)   Wt 179 lb (81.2 kg)   SpO2 99%   BMI 28.46 kg/m   Body mass index is 28.46 kg/m. Gen: Afebrile. No acute distress. Nontoxic in appearance, well developed, well nourished.  HENT: AT. [place]. Eyes:Pupils Equal Round Reactive to light, Extraocular movements intact,  Conjunctiva without redness, discharge or icterus. Neuro/msk: slow guarded gait. PERLA. EOMi. Alert. Oriented x3 Muscle strength 5/5 BLE extremity. No lumbar bone tenderness. Lumbar straightening.  TTP right lumbar paraspinal and right SI. ropiness bilateral lumbar paraspinal . NV intact distally.  Psych: Normal affect, dress and demeanor. Normal speech. Normal thought content and judgment.  No exam data present No results found. No results found for this or any previous visit (from the past 24 hour(s)).  Assessment/Plan: Kristin Forbes is a 49 y.o. female present for OV for  lumbar strain - rest, heat, nsaids.  No lifting. May start light stretches in 1 week if improving.  IM depo medrol injection supplied today. Injection has been helpful in the past.  - zanaflex prescribed.  - avs information provided on lumbar rehab/stretches - pt was counseled on COVID vaccines for the community and provided with HD information.  - f/u PRN   Reviewed expectations re: course of current medical issues.  Discussed self-management of symptoms.  Outlined signs and symptoms indicating need for more acute intervention.  Patient verbalized understanding and all questions were answered.  Patient received an After-Visit Summary.    No orders of the defined types were placed in this encounter.  Meds ordered this encounter  Medications  . tiZANidine (ZANAFLEX) 4 MG tablet    Sig: Take 1 tablet (4 mg total) by mouth every 6 (six) hours as needed for muscle spasms.    Dispense:  60 tablet    Refill:  0  . methylPREDNISolone acetate (DEPO-MEDROL) injection 80 mg      Note is dictated utilizing voice recognition software. Although note has been proof read prior to signing,  occasional typographical errors still can be missed. If any questions arise, please do not hesitate to call for verification.   electronically signed by:  Howard Pouch, DO  [place]

## 2019-07-13 ENCOUNTER — Encounter: Payer: Self-pay | Admitting: Family Medicine

## 2019-07-28 IMAGING — US US ABDOMEN COMPLETE
1 series · 14 of 25 positions shown · non-contrast
Comparison: None.

CLINICAL DATA: Elevated liver function tests

EXAM:
ABDOMEN ULTRASOUND COMPLETE

[Series 1: us abdomen complete · 0.20mm/px · 14 of 87 slices shown]
[im 1/87]
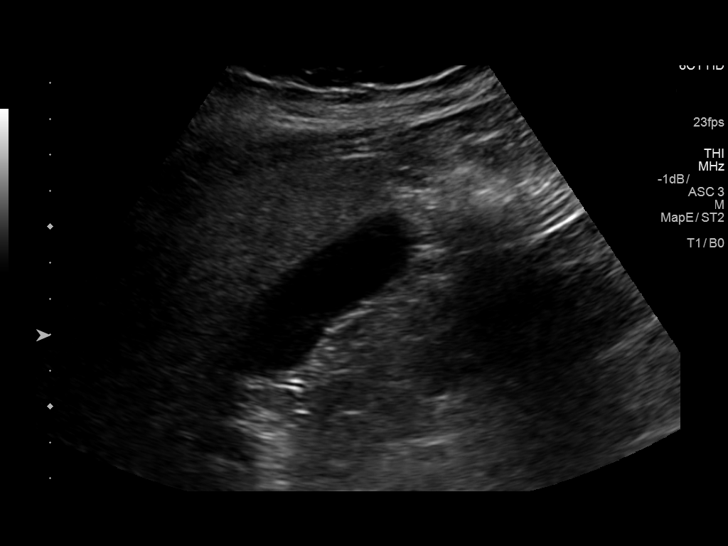
[im 8/87]
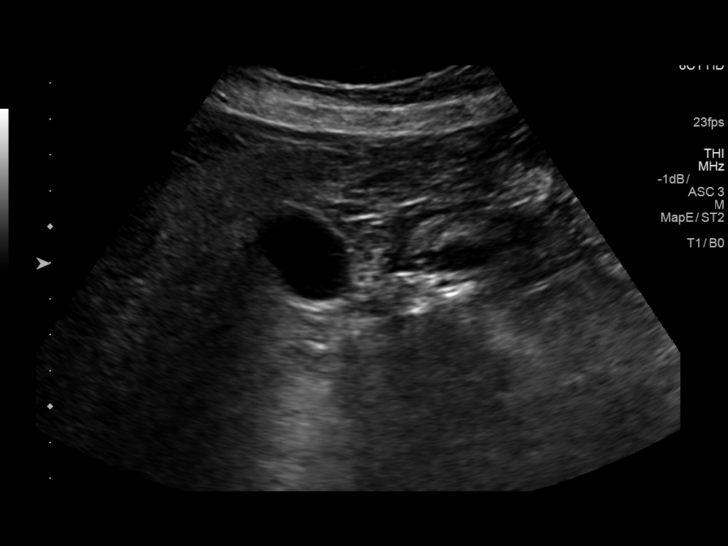
[im 15/87]
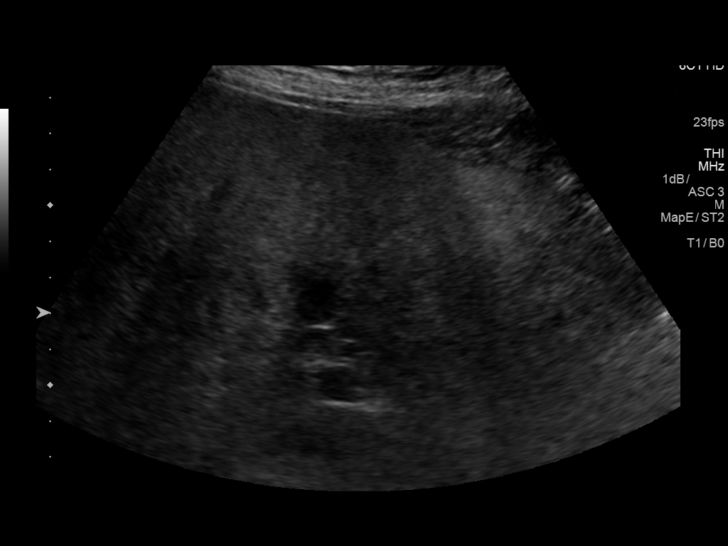
[im 22/87]
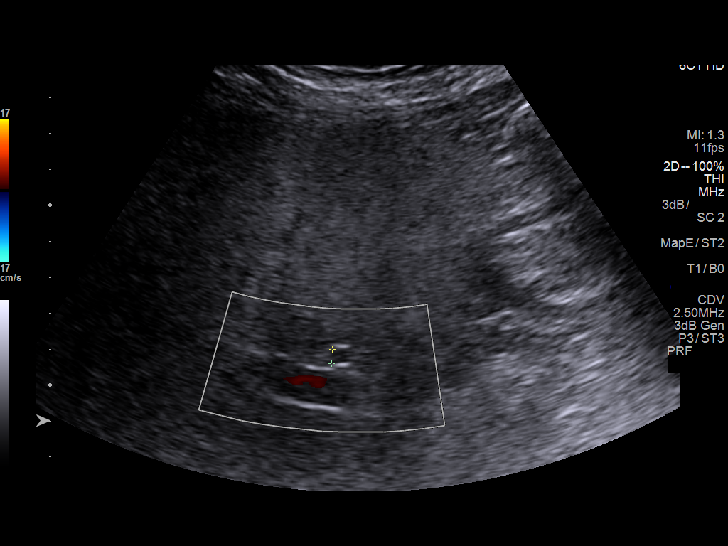
[im 29/87]
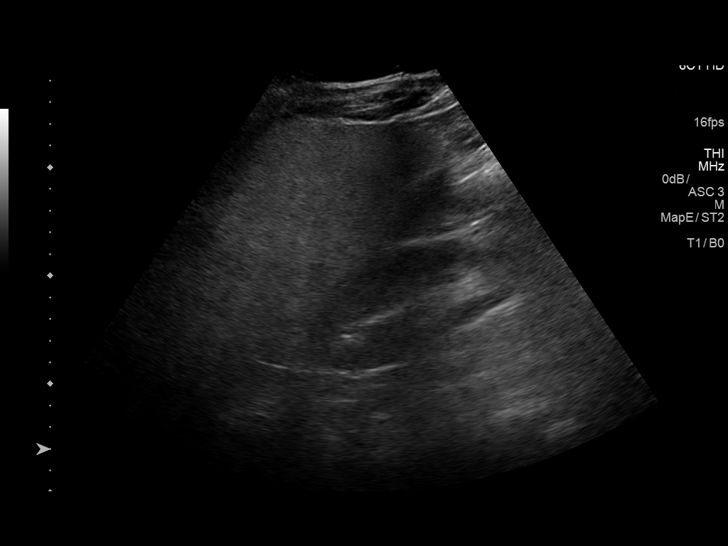
[im 33/87]
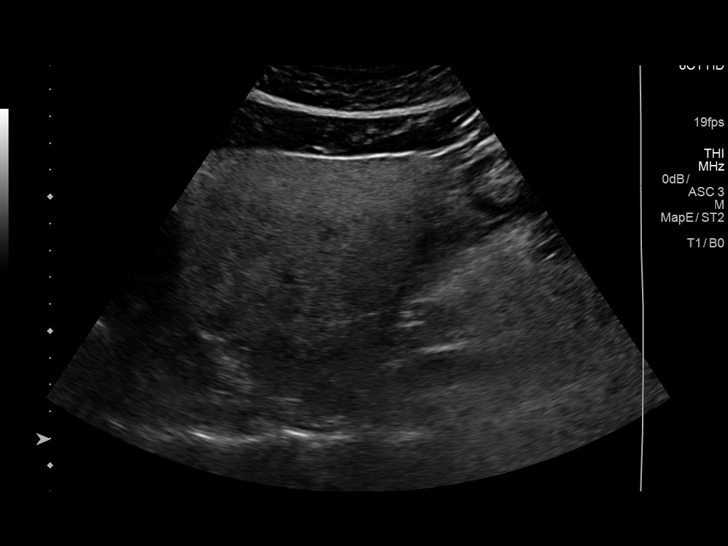
[im 40/87]
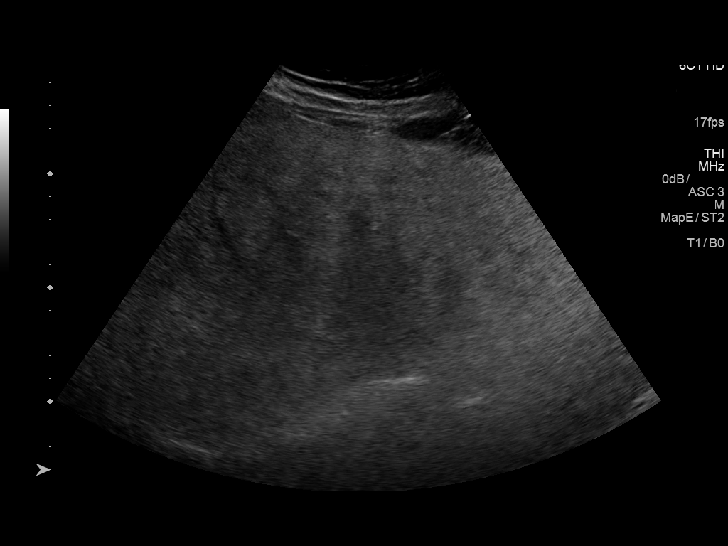
[im 47/87]
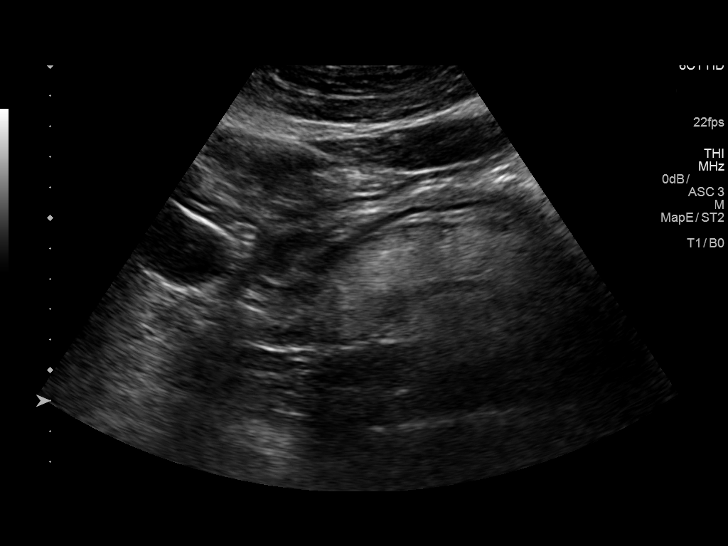
[im 54/87]
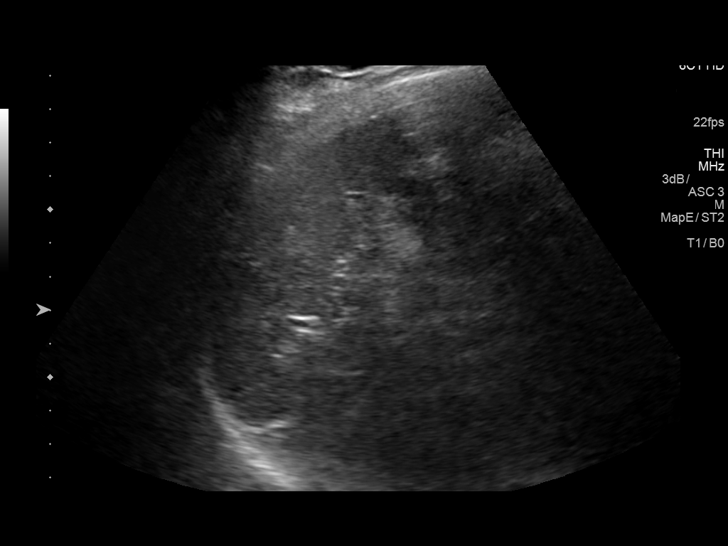
[im 58/87]
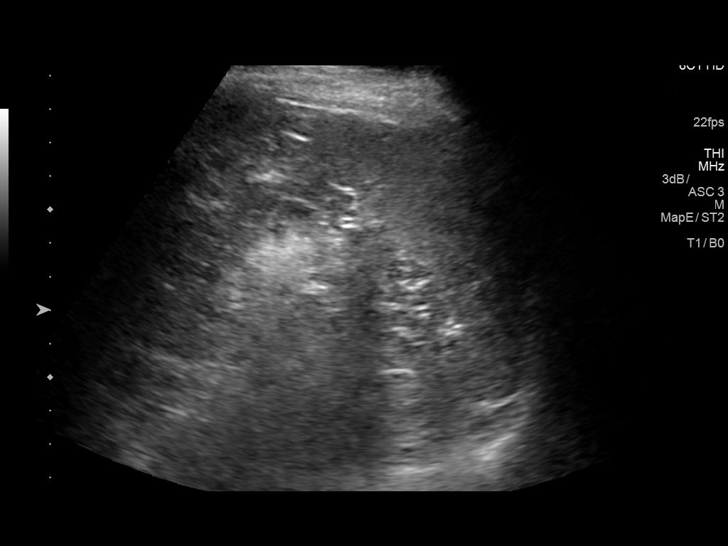
[im 65/87]
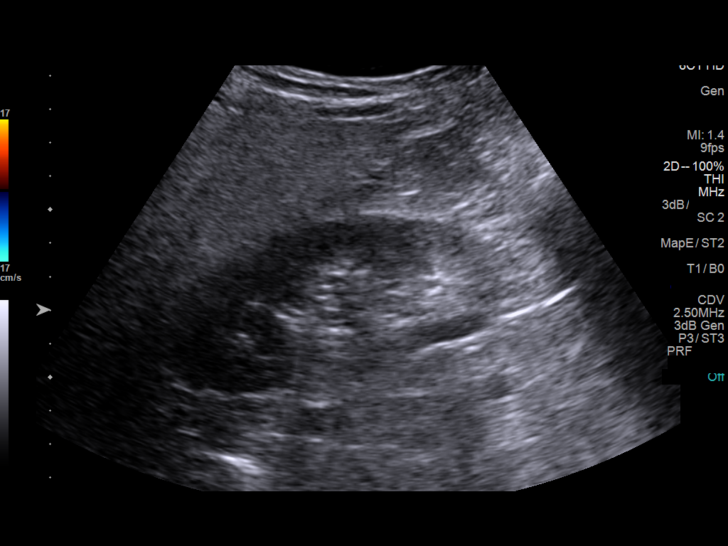
[im 72/87]
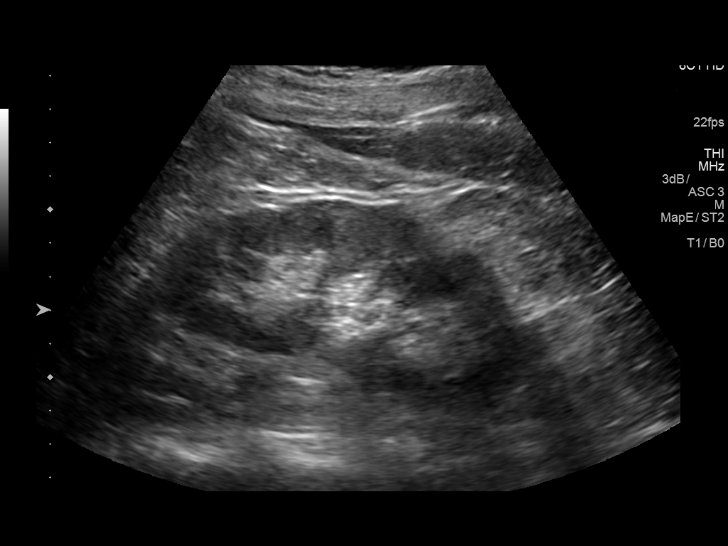
[im 79/87]
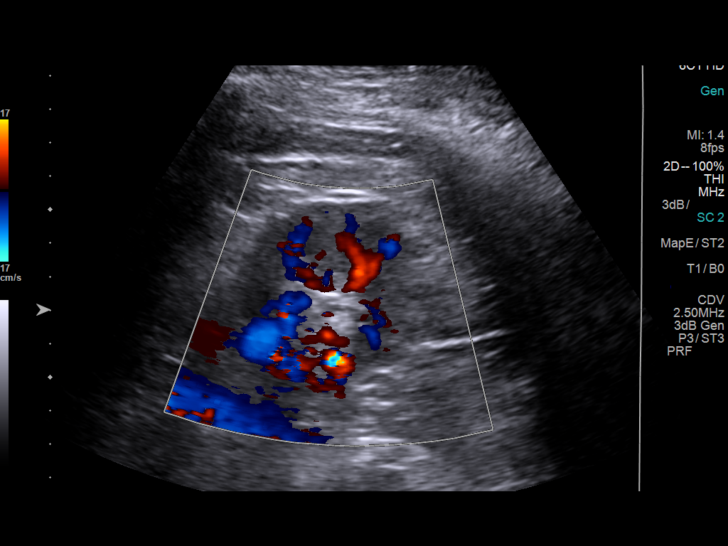
[im 87/87]
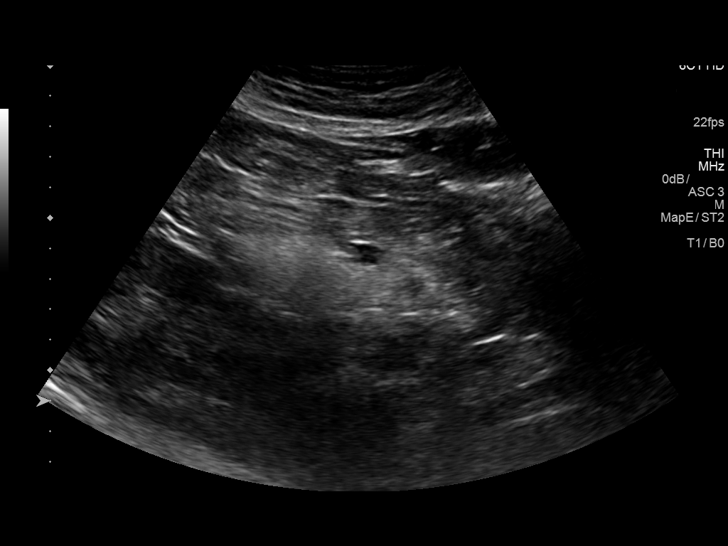

[14 of 25 positions shown; findings below may reference images not displayed]

FINDINGS: Gallbladder: No gallstones or wall thickening visualized. No
sonographic Murphy sign noted by sonographer.

Common bile duct: Diameter: 4 mm. Very limited visualization due to
patient factors.

Liver: Echogenic liver with poor acoustic penetration, exacerbated
by patient size. Portal vein is patent on color Doppler imaging with
normal direction of blood flow towards the liver.

IVC: No abnormality visualized.

Pancreas: Visualized portion unremarkable.

Spleen: Size and appearance within normal limits.

Right Kidney: Length: 11.5 cm. Echogenicity within normal limits. No
mass or hydronephrosis visualized.

Left Kidney: Length: 11.4 cm. Echogenicity within normal limits. No
mass or hydronephrosis visualized.

Abdominal aorta: No aneurysm visualized.

Other findings: None.
IMPRESSION: Hepatic steatosis.

## 2019-08-12 ENCOUNTER — Ambulatory Visit: Payer: BC Managed Care – PPO | Attending: Internal Medicine

## 2019-08-12 DIAGNOSIS — Z23 Encounter for immunization: Secondary | ICD-10-CM

## 2019-08-12 NOTE — Progress Notes (Signed)
   Covid-19 Vaccination Clinic  Name:  Kristin Forbes    MRN: [id number] DOB: [birth year]  08/12/2019  [name] was observed post Covid-19 immunization for 15 minutes without incident. She was provided with Vaccine Information Sheet and instruction to access the V-Safe system.   [name] was instructed to call 911 with any severe reactions post vaccine: [facility] Difficulty breathing  . Swelling of face and throat  . A fast heartbeat  . A bad rash all over body  . Dizziness and weakness   Immunizations Administered    Name Date Dose VIS Date Route   Pfizer COVID-19 Vaccine 08/12/2019 11:59 AM 0.3 mL 04/30/2019 Intramuscular   Manufacturer: [employer], [employer]   Lot: [id number]   NDC: [address]-6

## 2019-09-06 ENCOUNTER — Ambulatory Visit: Payer: BC Managed Care – PPO | Attending: Internal Medicine

## 2019-09-06 DIAGNOSIS — Z23 Encounter for immunization: Secondary | ICD-10-CM

## 2019-09-06 NOTE — Progress Notes (Signed)
   Covid-19 Vaccination Clinic  Name:  Kristin Forbes    MRN: [id number] DOB: [birth year]  09/06/2019  [name] was observed post Covid-19 immunization for 15 minutes without incident. She was provided with Vaccine Information Sheet and instruction to access the V-Safe system.   [name] was instructed to call 911 with any severe reactions post vaccine: [facility] Difficulty breathing  . Swelling of face and throat  . A fast heartbeat  . A bad rash all over body  . Dizziness and weakness   Immunizations Administered    Name Date Dose VIS Date Route   Pfizer COVID-19 Vaccine 09/06/2019 12:11 PM 0.3 mL 07/14/2018 Intramuscular   Manufacturer: [employer], [employer]   Lot: [id number]   NDC: [address]-8

## 2019-10-08 ENCOUNTER — Ambulatory Visit: Payer: BC Managed Care – PPO | Admitting: Family Medicine

## 2019-10-22 ENCOUNTER — Ambulatory Visit: Payer: BC Managed Care – PPO | Admitting: Family Medicine

## 2019-10-30 ENCOUNTER — Encounter: Payer: Self-pay | Admitting: Family Medicine

## 2019-11-01 ENCOUNTER — Telehealth: Payer: Self-pay

## 2019-11-01 NOTE — Telephone Encounter (Signed)
A user error has taken place: encounter opened in error, closed for administrative reasons.

## 2019-12-03 ENCOUNTER — Ambulatory Visit: Payer: BC Managed Care – PPO | Admitting: Family Medicine

## 2019-12-03 ENCOUNTER — Other Ambulatory Visit: Payer: Self-pay

## 2019-12-03 ENCOUNTER — Encounter: Payer: Self-pay | Admitting: Family Medicine

## 2019-12-03 VITALS — BP 138/89 | HR 65 | Temp 98.1°F | Resp 18 | Ht 67.0 in | Wt 172.0 lb

## 2019-12-03 DIAGNOSIS — D7589 Other specified diseases of blood and blood-forming organs: Secondary | ICD-10-CM

## 2019-12-03 DIAGNOSIS — R7989 Other specified abnormal findings of blood chemistry: Secondary | ICD-10-CM

## 2019-12-03 DIAGNOSIS — K760 Fatty (change of) liver, not elsewhere classified: Secondary | ICD-10-CM

## 2019-12-03 DIAGNOSIS — Z1211 Encounter for screening for malignant neoplasm of colon: Secondary | ICD-10-CM

## 2019-12-03 DIAGNOSIS — E559 Vitamin D deficiency, unspecified: Secondary | ICD-10-CM

## 2019-12-03 LAB — CBC WITH DIFFERENTIAL/PLATELET
Basophils Absolute: 0 10*3/uL (ref 0.0–0.1)
Basophils Relative: 0.6 % (ref 0.0–3.0)
Eosinophils Absolute: 0.2 10*3/uL (ref 0.0–0.7)
Eosinophils Relative: 4.4 % (ref 0.0–5.0)
HCT: 44.6 % (ref 36.0–46.0)
Hemoglobin: 15.2 g/dL — ABNORMAL HIGH (ref 12.0–15.0)
Lymphocytes Relative: 20.4 % (ref 12.0–46.0)
Lymphs Abs: 1 10*3/uL (ref 0.7–4.0)
MCHC: 34 g/dL (ref 30.0–36.0)
MCV: 102.8 fl — ABNORMAL HIGH (ref 78.0–100.0)
Monocytes Absolute: 0.6 10*3/uL (ref 0.1–1.0)
Monocytes Relative: 12.6 % — ABNORMAL HIGH (ref 3.0–12.0)
Neutro Abs: 2.9 10*3/uL (ref 1.4–7.7)
Neutrophils Relative %: 62 % (ref 43.0–77.0)
Platelets: 149 10*3/uL — ABNORMAL LOW (ref 150.0–400.0)
RBC: 4.34 Mil/uL (ref 3.87–5.11)
RDW: 13 % (ref 11.5–15.5)
WBC: 4.7 10*3/uL (ref 4.0–10.5)

## 2019-12-03 LAB — HEPATIC FUNCTION PANEL
ALT: 70 U/L — ABNORMAL HIGH (ref 0–35)
AST: 85 U/L — ABNORMAL HIGH (ref 0–37)
Albumin: 4.3 g/dL (ref 3.5–5.2)
Alkaline Phosphatase: 53 U/L (ref 39–117)
Bilirubin, Direct: 0.2 mg/dL (ref 0.0–0.3)
Total Bilirubin: 0.9 mg/dL (ref 0.2–1.2)
Total Protein: 7.1 g/dL (ref 6.0–8.3)

## 2019-12-03 LAB — VITAMIN D 25 HYDROXY (VIT D DEFICIENCY, FRACTURES): VITD: 41.24 ng/mL (ref 30.00–100.00)

## 2019-12-03 LAB — VITAMIN B12: Vitamin B-12: 436 pg/mL (ref 211–911)

## 2019-12-03 NOTE — Progress Notes (Signed)
This visit occurred during the SARS-CoV-2 public health emergency.  Safety protocols were in place, including screening questions prior to the visit, additional usage of staff PPE, and extensive cleaning of exam room while observing appropriate contact time as indicated for disinfecting solutions.    Kristin Forbes , [birth year], 49 y.o., female MRN: [id number] Patient Care Team    Relationship Specialty Notifications Start End  Natalia Leatherwood, DO PCP - General Family Medicine  08/13/17   Ginette Otto, [facility]    08/13/17   Arminda Resides, MD Consulting Physician Dermatology  08/13/17     Chief Complaint  Patient presents with  . Follow-up    Macrocytosis, elevated liver enzymes and vitamin D deficiency     Subjective: Pt presents for an OV follow-up on macrocytosis, elevated liver enzymes and vitamin D deficiency.  Elevated LFTs/Elevated hemoglobin (HCC)/macrocytosis Patient was seen August 13, 2017 and found to have elevated liver enzymes; ALT 63 and AST 58. Mildly abnormal liver enzymes with an ALT 39 and 2017. ID labs normal. B vitamins normal. TSh normal.  Reports she does consume alcohol approximately 2 drinks daily.Non-smoker. She was referred and cancelled her appt to [facility].  Mixed hyperlipidemia/Overweight (BMI 25.0-29.9)/prediabetes Fhxof heart disease in her mother, AAA and her maternal grandfather, stroke in her paternal grandmother. Compliance with Zeita and fish oil. Last a1c 6.0 in 11/ 2019 Depression screen [facility] 2/9 03/10/2019 04/09/2018 08/13/2017  Decreased Interest 1 0 0  Down, Depressed, Hopeless 0 0 0  PHQ - 2 Score 1 0 0  Altered sleeping - 0 3  Tired, decreased energy - 0 1  Change in appetite - 0 0  Feeling bad or failure about yourself  - 0 0  Trouble concentrating - 0 0  Moving slowly or fidgety/restless - 0 0  Suicidal thoughts - 0 0  PHQ-9 Score - 0 4  Difficult doing work/chores - Not difficult at all Not difficult at all     No Known Allergies Social History   Social History Narrative   Married.  Children.   Bachelors in Occupational psychologist.  Works as a Warden/ranger.   Smoke alarms in the home.  Wears her seatbelt.   Feels safe in her relationships.   Past Medical History:  Diagnosis Date  . Allergic rhinitis 07/23/2007  . Chicken pox   . Chronic back pain 09/13/2010  . Depression 05/09/2010  . Dysmenorrhea 03/08/2011  . Insomnia 04/03/2010  . Rosacea 02/16/2010  . UTI (urinary tract infection)   . Vitamin D deficiency 09/13/2010   Past Surgical History:  Procedure Laterality Date  . DILATION AND CURETTAGE OF UTERUS     Family History  Problem Relation Age of Onset  . CAD Mother   . Heart attack Mother 43  . Heart disease Mother   . Pulmonary fibrosis Mother   . Nephrolithiasis Father   . AAA (abdominal aortic aneurysm) Maternal Grandfather   . Stroke Paternal Grandmother   . Nephrolithiasis Paternal Grandfather    Allergies as of 12/03/2019   No Known Allergies     Medication List       Accurate as of December 03, 2019 11:59 PM. If you have any questions, ask your nurse or doctor.        cholecalciferol 25 MCG (1000 UNIT) tablet Commonly known as: VITAMIN D3 Take 1,000 Units by mouth daily.   DOXYCYCLINE HYCLATE PO Take 100 mg by mouth daily.   ezetimibe 10 MG tablet Commonly known  as: ZETIA Take 1 tablet (10 mg total) by mouth daily.   ibuprofen 800 MG tablet Commonly known as: ADVIL Take 800 mg by mouth every 8 (eight) hours as needed.   tiZANidine 4 MG tablet Commonly known as: Zanaflex Take 1 tablet (4 mg total) by mouth every 6 (six) hours as needed for muscle spasms.   XANAX PO Take 0.25 mg by mouth as needed.       All past medical history, surgical history, allergies, family history, immunizations andmedications were updated in the EMR today and reviewed under the history and medication portions of their EMR.     ROS: Negative, with the exception of above  mentioned in HPI   Objective:  BP 138/89 (BP Location: Left Arm, Patient Position: Sitting, Cuff Size: Normal)   Pulse 65   Temp 98.1 F (36.7 C) (Temporal)   Resp 18   Ht 5\' 7"  (1.702 m)   Wt 172 lb (78 kg)   LMP 11/12/2019 (Approximate)   SpO2 99%   BMI 26.94 kg/m  Body mass index is 26.94 kg/m. Gen: Afebrile. No acute distress. Nontoxic in appearance, well developed, well nourished.  HENT: AT. [place].  Eyes:Pupils Equal Round Reactive to light, Extraocular movements intact,  Conjunctiva without redness, discharge or icterus. CV: RRR, no edema Chest: CTAB, no wheeze or crackles. Good air movement, normal resp effort.  Abd: Soft. NTND. BS present. no Masses palpated. No rebound or guarding. Skin: no rashes, purpura or petechiae.  Neuro:  Normal gait. PERLA. EOMi. Alert. Oriented x3  Psych: Normal affect, dress and demeanor. Normal speech. Normal thought content and judgment.  No exam data present No results found. No results found for this or any previous visit (from the past 24 hour(s)).  Assessment/Plan: Kristin Forbes is a 49 y.o. female present for OV for  Macrocytosis without anemia/Elevated LFTs/elevated hemoglobin -Recheck liver enzymes and CBC today. - referred to [facility] for further eval (12/05/2017), pt cancelled [facility] establishment appt. -Continue multivitamin and B supplementations - cautioned on alcohol use.  - B- vitamins, ID labs and TSh normal.  - CBC and LFT collected today  Mixed hyperlipidemia/Hepatic steatosis - dietary modifications- routine exercise.  - caution on alcohol use.  -Continue Zetia - 12/07/2017 12/2017 resulted with hepatic steatosis.   Vitamin D deficiency/low B12 -Continue supplement 1000 units daily, patient will be guided on any changes needed to supplement dose after labs are received. -Vitamin D (25 hydroxy) -B12 collected today  Colon cancer screening: Patient would like Cologuard testing  Reviewed expectations re: course of current  medical issues.  Discussed self-management of symptoms.  Outlined signs and symptoms indicating need for more acute intervention.  Patient verbalized understanding and all questions were answered.  Patient received an After-Visit Summary.    Orders Placed This Encounter  Procedures  . Vitamin D (25 hydroxy)  . CBC w/Diff  . B12  . Hepatic function panel   No orders of the defined types were placed in this encounter.  Referral Orders  No referral(s) requested today     Note is dictated utilizing voice recognition software. Although note has been proof read prior to signing, occasional typographical errors still can be missed. If any questions arise, please do not hesitate to call for verification.   electronically signed by:  01/2018, DO  [facility] - [place]

## 2019-12-03 NOTE — Patient Instructions (Signed)
We will call you with results and guide you on further need for evaluation.   I have placed the cologuard screening order for you. You should be hearing from the company and kit will be mailed to you.

## 2019-12-06 ENCOUNTER — Telehealth: Payer: Self-pay | Admitting: Family Medicine

## 2019-12-06 DIAGNOSIS — D7589 Other specified diseases of blood and blood-forming organs: Secondary | ICD-10-CM

## 2019-12-06 DIAGNOSIS — R7989 Other specified abnormal findings of blood chemistry: Secondary | ICD-10-CM

## 2019-12-06 DIAGNOSIS — D582 Other hemoglobinopathies: Secondary | ICD-10-CM

## 2019-12-06 DIAGNOSIS — D696 Thrombocytopenia, unspecified: Secondary | ICD-10-CM

## 2019-12-06 NOTE — Telephone Encounter (Signed)
Please call patient: Her liver enzymes remain elevated, mildly increased from lab work last October with AST 85/ALT 70.  Enzymes have been steadily increasing over the last 2 years.  Since they continue to elevate I have placed a referral to gastroenterology for her today. Red blood cell size is still higher than normal, about the same as prior with an MCV of 102.  However she does have mildly increased hemoglobin numbers and mildly decreased platelet numbers, which are all cells in her bloodstream.  With these changes and her continued increased size and red blood cell, I do believe she should follow-up with hematology and I have placed that referral for her today. Vitamin D and B12 are now within normal limits, continue current dose of supplementations.  No further evaluation needed on vitamin levels.   She will be hearing from both hematology and gastroenterology offices to schedule appointments for evaluation.

## 2019-12-06 NOTE — Telephone Encounter (Signed)
Called patient reviewed all information and repeated back to me. Will call if any questions.  She will let our office know she has not received a call about referrals by next week.

## 2019-12-20 ENCOUNTER — Encounter: Payer: Self-pay | Admitting: Gastroenterology

## 2019-12-20 ENCOUNTER — Telehealth: Payer: Self-pay | Admitting: Hematology

## 2019-12-20 NOTE — Telephone Encounter (Signed)
Received a new hem referral from Dr. Claiborne Billings for elevated LFTs/thrombocytopenia/macrocytic anemia. Kristin Forbes has returned my call and has been scheduled to see Dr. Candise Che on 9/7 at 1pm. Pt aware to arrive 15 minutes early.

## 2019-12-30 DIAGNOSIS — Z1211 Encounter for screening for malignant neoplasm of colon: Secondary | ICD-10-CM | POA: Diagnosis not present

## 2020-01-06 LAB — COLOGUARD
COLOGUARD: NEGATIVE
Cologuard: NEGATIVE

## 2020-01-25 ENCOUNTER — Telehealth: Payer: Self-pay | Admitting: Hematology

## 2020-01-25 ENCOUNTER — Inpatient Hospital Stay: Payer: BC Managed Care – PPO | Admitting: Hematology

## 2020-01-25 NOTE — Telephone Encounter (Signed)
Pt cld to r/s her new hem appt w/Dr. Candise Che to 9/30 at 11am.

## 2020-02-17 ENCOUNTER — Telehealth: Payer: Self-pay

## 2020-02-17 ENCOUNTER — Encounter: Payer: Self-pay | Admitting: Family Medicine

## 2020-02-17 ENCOUNTER — Inpatient Hospital Stay: Payer: BC Managed Care – PPO | Attending: Hematology | Admitting: Hematology

## 2020-02-17 ENCOUNTER — Other Ambulatory Visit: Payer: Self-pay

## 2020-02-17 VITALS — BP 146/88 | HR 84 | Temp 98.9°F | Resp 18 | Ht 67.0 in | Wt 170.9 lb

## 2020-02-17 DIAGNOSIS — R7989 Other specified abnormal findings of blood chemistry: Secondary | ICD-10-CM | POA: Diagnosis not present

## 2020-02-17 DIAGNOSIS — D696 Thrombocytopenia, unspecified: Secondary | ICD-10-CM | POA: Diagnosis not present

## 2020-02-17 DIAGNOSIS — D7589 Other specified diseases of blood and blood-forming organs: Secondary | ICD-10-CM

## 2020-02-17 DIAGNOSIS — D539 Nutritional anemia, unspecified: Secondary | ICD-10-CM | POA: Insufficient documentation

## 2020-02-17 DIAGNOSIS — K760 Fatty (change of) liver, not elsewhere classified: Secondary | ICD-10-CM | POA: Diagnosis not present

## 2020-02-17 DIAGNOSIS — Z79899 Other long term (current) drug therapy: Secondary | ICD-10-CM | POA: Insufficient documentation

## 2020-02-17 DIAGNOSIS — Z791 Long term (current) use of non-steroidal anti-inflammatories (NSAID): Secondary | ICD-10-CM | POA: Diagnosis not present

## 2020-02-17 NOTE — Telephone Encounter (Signed)
Started in error

## 2020-02-17 NOTE — Progress Notes (Signed)
HEMATOLOGY/ONCOLOGY CONSULTATION NOTE  Date of Service: 02/17/2020  Patient Care Team: Natalia Leatherwood, DO as PCP - General (Family Medicine) [facility], [facility] Arminda Resides, MD as Consulting Physician (Dermatology)  CHIEF COMPLAINTS/PURPOSE OF CONSULTATION:  Elevated LFTs/thrombocytopenia/macrocytic anemia  HISTORY OF PRESENTING ILLNESS:  Kristin Forbes is a wonderful 49 y.o. female who has been referred to [place] by Dr. Claiborne Billings for evaluation and management of elevated LFTs/thrombocytopenia/macrocytic anemia. The pt reports that she is doing well overall.   The pt reports that she has felt well over the last six months and denies any new symptoms. She was not told any reason for her Fatty Liver. Pt is taking Doxycycline for Rosacea. She is taking a daily multivitamin.   Of note prior to the patient's visit today, pt has had US Abdomen ([id number]) completed on 12/25/2017 with results revealing "Hepatic steatosis."   Most recent lab results (12/03/2019) of CBC w/diff is as follows: all values are WNL except for Hgb at 15.2, MCV at 102.8, PLT at 149.0K, Mono Rel at 12.6K. 12/03/2019 Hepatic function is follows: AST at 85, ALT at 70 12/03/2019 Vitamin D at 41.24 12/03/2019 Vitamin B12 at 436  On review of systems, pt denies fevers, chills, new bone pain, unexpected weight loss, new fatigue, back pain, abdominal pain, leg swelling, rash and any other symptoms.   On PMHx the pt reports Fatty Liver, Rosacea. On Social Hx the pt reports that she drinks 0-1 drinks per day during the week. She exceeds 3 drinks at a time about twice per week.  MEDICAL HISTORY:  Past Medical History:  Diagnosis Date  . Allergic rhinitis 07/23/2007  . Chicken pox   . Chronic back pain 09/13/2010  . Depression 05/09/2010  . Dysmenorrhea 03/08/2011  . Insomnia 04/03/2010  . Rosacea 02/16/2010  . UTI (urinary tract infection)   . Vitamin D deficiency 09/13/2010    SURGICAL  HISTORY: Past Surgical History:  Procedure Laterality Date  . DILATION AND CURETTAGE OF UTERUS      SOCIAL HISTORY: Social History   Socioeconomic History  . Marital status: Married    Spouse name: Not on file  . Number of children: Not on file  . Years of education: Not on file  . Highest education level: Not on file  Occupational History  . Occupation: Warden/ranger  Tobacco Use  . Smoking status: Never Smoker  . Smokeless tobacco: Never Used  Vaping Use  . Vaping Use: Never used  Substance and Sexual Activity  . Alcohol use: Yes    Alcohol/week: 3.0 standard drinks    Types: 3 Glasses of wine per week    Comment: 1-3 glasses of wine weekly  . Drug use: Never  . Sexual activity: Yes    Partners: Male    Birth control/protection: None  Other Topics Concern  . Not on file  Social History Narrative   Married.  Children.   Bachelors in Occupational psychologist.  Works as a Warden/ranger.   Smoke alarms in the home.  Wears her seatbelt.   Feels safe in her relationships.   Social Determinants of Health   Financial Resource Strain:   . Difficulty of Paying Living Expenses: Not on file  Food Insecurity:   . Worried About Programme researcher, broadcasting/film/video in the Last Year: Not on file  . Ran Out of Food in the Last Year: Not on file  Transportation Needs:   . Lack of Transportation (Medical): Not on file  .  Lack of Transportation (Non-Medical): Not on file  Physical Activity:   . Days of Exercise per Week: Not on file  . Minutes of Exercise per Session: Not on file  Stress:   . Feeling of Stress : Not on file  Social Connections:   . Frequency of Communication with Friends and Family: Not on file  . Frequency of Social Gatherings with Friends and Family: Not on file  . Attends Religious Services: Not on file  . Active Member of Clubs or Organizations: Not on file  . Attends Banker Meetings: Not on file  . Marital Status: Not on file  Intimate Partner Violence:   . Fear of  Current or Ex-Partner: Not on file  . Emotionally Abused: Not on file  . Physically Abused: Not on file  . Sexually Abused: Not on file    FAMILY HISTORY: Family History  Problem Relation Age of Onset  . CAD Mother   . Heart attack Mother 65  . Heart disease Mother   . Pulmonary fibrosis Mother   . Nephrolithiasis Father   . AAA (abdominal aortic aneurysm) Maternal Grandfather   . Stroke Paternal Grandmother   . Nephrolithiasis Paternal Grandfather     ALLERGIES:  has No Known Allergies.  MEDICATIONS:  Current Outpatient Medications  Medication Sig Dispense Refill  . cholecalciferol (VITAMIN D3) 25 MCG (1000 UT) tablet Take 1,000 Units by mouth daily.    Marland Kitchen ezetimibe (ZETIA) 10 MG tablet Take 1 tablet (10 mg total) by mouth daily. 90 tablet 4  . ibuprofen (ADVIL) 800 MG tablet Take 800 mg by mouth every 8 (eight) hours as needed.     No current facility-administered medications for this visit.    REVIEW OF SYSTEMS:    10 Point review of Systems was done is negative except as noted above.  PHYSICAL EXAMINATION: ECOG PERFORMANCE STATUS: 0 - Asymptomatic  . Vitals:   02/17/20 1108  BP: (!) 146/88  Pulse: 84  Resp: 18  Temp: 98.9 F (37.2 C)  SpO2: 100%   Filed Weights   02/17/20 1108  Weight: 170 lb 14.4 oz (77.5 kg)   .Body mass index is 26.77 kg/m.  GENERAL:alert, in no acute distress and comfortable SKIN: no acute rashes, no significant lesions EYES: conjunctiva are pink and non-injected, sclera anicteric OROPHARYNX: MMM, no exudates, no oropharyngeal erythema or ulceration NECK: supple, no JVD LYMPH:  no palpable lymphadenopathy in the cervical, axillary or inguinal regions LUNGS: clear to auscultation b/l with normal respiratory effort HEART: regular rate & rhythm ABDOMEN:  normoactive bowel sounds , non tender, not distended. No hepatosplenomegaly.  Extremity: no pedal edema PSYCH: alert & oriented x 3 with fluent speech NEURO: no focal  motor/sensory deficits  LABORATORY DATA:  I have reviewed the data as listed  . CBC Latest Ref Rng & Units 12/03/2019 03/10/2019 12/03/2017  WBC 4.0 - 10.5 K/uL 4.7 5.3 5.6  Hemoglobin 12.0 - 15.0 g/dL 15.2(H) 15.1(H) 15.6(H)  Hematocrit 36 - 46 % 44.6 44.4 46.0  Platelets 150 - 400 K/uL 149.0(L) 172.0 174.0   . CBC    Component Value Date/Time   WBC 4.7 12/03/2019 0935   RBC 4.34 12/03/2019 0935   HGB 15.2 (H) 12/03/2019 0935   HCT 44.6 12/03/2019 0935   PLT 149.0 (L) 12/03/2019 0935   MCV 102.8 (H) 12/03/2019 0935   MCHC 34.0 12/03/2019 0935   RDW 13.0 12/03/2019 0935   LYMPHSABS 1.0 12/03/2019 0935   MONOABS 0.6 12/03/2019 0935  EOSABS 0.2 12/03/2019 0935   BASOSABS 0.0 12/03/2019 0935    . CMP Latest Ref Rng & Units 12/03/2019 03/10/2019 12/03/2017  Glucose 70 - 99 mg/dL - 161(W) 960(A)  BUN 6 - 23 mg/dL - 8 9  Creatinine 5.40 - 1.20 mg/dL - 9.81 1.91  Sodium 478 - 145 mEq/L - 137 138  Potassium 3.5 - 5.1 mEq/L - 4.2 4.1  Chloride 96 - 112 mEq/L - 101 103  CO2 19 - 32 mEq/L - 28 26  Calcium 8.4 - 10.5 mg/dL - 9.1 9.0  Total Protein 6.0 - 8.3 g/dL 7.1 7.0 7.1  Total Bilirubin 0.2 - 1.2 mg/dL 0.9 0.7 0.6  Alkaline Phos 39 - 117 U/L 53 61 55  AST 0 - 37 U/L 85(H) 71(H) 41(H)  ALT 0 - 35 U/L 70(H) 64(H) 42(H)     RADIOGRAPHIC STUDIES: I have personally reviewed the radiological images as listed and agreed with the findings in the report. No results found.  ASSESSMENT & PLAN:   49 yo with  1) RBC macrocytosis without anemia PLAN: -Discussed patient's most recent labs from 12/03/2019, WBC are nml, PLT are borderline low, Hgb & MCV is elevated, liver enzymes are elevated, Vitamin D & B12 are okay.  -Advised pt that larger RBC could be caused by nutritional deficiencies, anemia, hemolysis, or a primary bone marrow disorder. -Advised pt that is unlikely that she has myelodysplastic changes due to her age.  -Advised pt that heavier alcohol use can cause nutritional  deficiencies. And can also cause RBC macrocytosis -Advised pt that elevated liver enzymes are caused by liver dysfunction. -Recommend pt f/u with Dr. Claiborne Billings for completion of liver disease w/o. -No unreasonable for pt to begin daily B-complex vitamin.  -Recommend pt get repeat labwork with Dr. Claiborne Billings in 6 months.   -If blood counts change will be happy to see back.  -RTC with PCP, will see back as needed.   FOLLOW UP: RTC with PCP   All of the patients questions were answered with apparent satisfaction. The patient knows to call the clinic with any problems, questions or concerns.  I spent 30 mins counseling the patient face to face. The total time spent in the appointment was 35 minutes and more than 50% was on counseling and direct patient cares.    Wyvonnia Lora MD MS AAHIVMS [facility] Hematology/Oncology Physician [facility]  (Office):       [phone] (Work cell):  [phone] (Fax):           [phone]  02/17/2020 9:20 AM  I, Carollee Herter, am acting as a scribe for Dr. Wyvonnia Lora.   .I have reviewed the above documentation for accuracy and completeness, and I agree with the above. Johney Maine MD

## 2020-02-17 NOTE — Telephone Encounter (Signed)
Pt sent a MyChart message regarding her cologuard that was done in august. Results were not resulted in Epic. Report date of 01/06/20.

## 2020-02-17 NOTE — Telephone Encounter (Signed)
Patient called inquiring on her Cologuard results.  We had not received her Cologuard results, but was able to view them on the Cologuard site. Her Cologuard test was negative.  This was a normal screening.  Next screening for colon cancer recommended in 3 years.   Please apologize to her for the confusion.  Unfortunately, we never received her results.  Please thank her for calling in, so we could retrieve them for her.

## 2020-02-17 NOTE — Addendum Note (Signed)
Addended by: Felix Pacini A on: 02/17/2020 12:25 PM   Modules accepted: Orders

## 2020-02-18 ENCOUNTER — Ambulatory Visit: Payer: BC Managed Care – PPO | Admitting: Gastroenterology

## 2020-03-15 ENCOUNTER — Encounter: Payer: Self-pay | Admitting: Family Medicine

## 2020-03-30 ENCOUNTER — Encounter: Payer: BC Managed Care – PPO | Admitting: Family Medicine

## 2020-04-10 ENCOUNTER — Encounter: Payer: Self-pay | Admitting: Family Medicine

## 2020-04-10 ENCOUNTER — Other Ambulatory Visit: Payer: Self-pay

## 2020-04-10 DIAGNOSIS — R7989 Other specified abnormal findings of blood chemistry: Secondary | ICD-10-CM

## 2020-04-10 DIAGNOSIS — E782 Mixed hyperlipidemia: Secondary | ICD-10-CM

## 2020-04-10 MED ORDER — EZETIMIBE 10 MG PO TABS: 10.0000 mg | ORAL_TABLET | Freq: Every day | ORAL | 1 refills | Status: DC

## 2020-04-18 ENCOUNTER — Encounter: Payer: BC Managed Care – PPO | Admitting: Family Medicine

## 2020-04-18 DIAGNOSIS — M7661 Achilles tendinitis, right leg: Secondary | ICD-10-CM | POA: Diagnosis not present

## 2020-04-18 DIAGNOSIS — M25571 Pain in right ankle and joints of right foot: Secondary | ICD-10-CM | POA: Diagnosis not present

## 2020-06-16 ENCOUNTER — Other Ambulatory Visit: Payer: Self-pay

## 2020-06-19 ENCOUNTER — Encounter: Payer: Self-pay | Admitting: Family Medicine

## 2020-06-19 ENCOUNTER — Other Ambulatory Visit: Payer: Self-pay

## 2020-06-19 ENCOUNTER — Ambulatory Visit (INDEPENDENT_AMBULATORY_CARE_PROVIDER_SITE_OTHER): Payer: BC Managed Care – PPO | Admitting: Family Medicine

## 2020-06-19 VITALS — BP 148/98 | HR 79 | Temp 97.2°F | Ht 66.0 in | Wt 171.0 lb

## 2020-06-19 DIAGNOSIS — Z0000 Encounter for general adult medical examination without abnormal findings: Secondary | ICD-10-CM

## 2020-06-19 DIAGNOSIS — E559 Vitamin D deficiency, unspecified: Secondary | ICD-10-CM | POA: Diagnosis not present

## 2020-06-19 DIAGNOSIS — E782 Mixed hyperlipidemia: Secondary | ICD-10-CM | POA: Diagnosis not present

## 2020-06-19 DIAGNOSIS — I10 Essential (primary) hypertension: Secondary | ICD-10-CM | POA: Insufficient documentation

## 2020-06-19 DIAGNOSIS — Z23 Encounter for immunization: Secondary | ICD-10-CM | POA: Insufficient documentation

## 2020-06-19 DIAGNOSIS — R7989 Other specified abnormal findings of blood chemistry: Secondary | ICD-10-CM

## 2020-06-19 DIAGNOSIS — R7303 Prediabetes: Secondary | ICD-10-CM | POA: Diagnosis not present

## 2020-06-19 DIAGNOSIS — E663 Overweight: Secondary | ICD-10-CM

## 2020-06-19 DIAGNOSIS — Z0001 Encounter for general adult medical examination with abnormal findings: Secondary | ICD-10-CM

## 2020-06-19 DIAGNOSIS — K760 Fatty (change of) liver, not elsewhere classified: Secondary | ICD-10-CM

## 2020-06-19 LAB — CBC WITH DIFFERENTIAL/PLATELET
Basophils Absolute: 0 10*3/uL (ref 0.0–0.1)
Basophils Relative: 0.9 % (ref 0.0–3.0)
Eosinophils Absolute: 0.2 10*3/uL (ref 0.0–0.7)
Eosinophils Relative: 4.2 % (ref 0.0–5.0)
HCT: 47.7 % — ABNORMAL HIGH (ref 36.0–46.0)
Hemoglobin: 15.9 g/dL — ABNORMAL HIGH (ref 12.0–15.0)
Lymphocytes Relative: 23.7 % (ref 12.0–46.0)
Lymphs Abs: 1.2 10*3/uL (ref 0.7–4.0)
MCHC: 33.4 g/dL (ref 30.0–36.0)
MCV: 99 fl (ref 78.0–100.0)
Monocytes Absolute: 0.5 10*3/uL (ref 0.1–1.0)
Monocytes Relative: 10.3 % (ref 3.0–12.0)
Neutro Abs: 3.1 10*3/uL (ref 1.4–7.7)
Neutrophils Relative %: 60.9 % (ref 43.0–77.0)
Platelets: 211 10*3/uL (ref 150.0–400.0)
RBC: 4.81 Mil/uL (ref 3.87–5.11)
RDW: 12.8 % (ref 11.5–15.5)
WBC: 5.1 10*3/uL (ref 4.0–10.5)

## 2020-06-19 LAB — VITAMIN D 25 HYDROXY (VIT D DEFICIENCY, FRACTURES): VITD: 30.68 ng/mL (ref 30.00–100.00)

## 2020-06-19 LAB — COMPREHENSIVE METABOLIC PANEL
ALT: 61 U/L — ABNORMAL HIGH (ref 0–35)
AST: 61 U/L — ABNORMAL HIGH (ref 0–37)
Albumin: 4.5 g/dL (ref 3.5–5.2)
Alkaline Phosphatase: 66 U/L (ref 39–117)
BUN: 10 mg/dL (ref 6–23)
CO2: 28 mEq/L (ref 19–32)
Calcium: 9.4 mg/dL (ref 8.4–10.5)
Chloride: 98 mEq/L (ref 96–112)
Creatinine, Ser: 0.78 mg/dL (ref 0.40–1.20)
GFR: 88.36 mL/min (ref >60.00)
Glucose, Bld: 109 mg/dL — ABNORMAL HIGH (ref 70–99)
Potassium: 3.9 mEq/L (ref 3.5–5.1)
Sodium: 136 mEq/L (ref 135–145)
Total Bilirubin: 0.7 mg/dL (ref 0.2–1.2)
Total Protein: 7.7 g/dL (ref 6.0–8.3)

## 2020-06-19 LAB — LIPID PANEL
Cholesterol: 246 mg/dL — ABNORMAL HIGH (ref 0–200)
HDL: 52.6 mg/dL (ref >39.00)
LDL Cholesterol: 159 mg/dL — ABNORMAL HIGH (ref 0–99)
NonHDL: 193.89
Total CHOL/HDL Ratio: 5
Triglycerides: 176 mg/dL — ABNORMAL HIGH (ref 0.0–149.0)
VLDL: 35.2 mg/dL (ref 0.0–40.0)

## 2020-06-19 LAB — HEMOGLOBIN A1C: Hgb A1c MFr Bld: 6.2 % (ref 4.6–6.5)

## 2020-06-19 LAB — TSH: TSH: 2.39 u[IU]/mL (ref 0.35–4.50)

## 2020-06-19 MED ORDER — EZETIMIBE 10 MG PO TABS: 10.0000 mg | ORAL_TABLET | Freq: Every day | ORAL | 1 refills | Status: DC

## 2020-06-19 MED ORDER — HYDROCHLOROTHIAZIDE 12.5 MG PO CAPS: 12.5000 mg | ORAL_CAPSULE | Freq: Every day | ORAL | 1 refills | Status: DC

## 2020-06-19 NOTE — Progress Notes (Signed)
This visit occurred during the SARS-CoV-2 public health emergency.  Safety protocols were in place, including screening questions prior to the visit, additional usage of staff PPE, and extensive cleaning of exam room while observing appropriate contact time as indicated for disinfecting solutions.    Patient ID: Kristin Forbes, female  DOB: 02-19-1971, 50 y.o.   MRN: [id number] Patient Care Team    Relationship Specialty Notifications Start End  Natalia Leatherwood, DO PCP - General Family Medicine  08/13/17   Ginette Otto, [facility]    08/13/17   Arminda Resides, MD Consulting Physician Dermatology  08/13/17     Chief Complaint  Patient presents with  . Annual Exam    Pt is fasting     Subjective: Kristin Forbes is a 50 y.o.  Female  present for CPE. All past medical history, surgical history, allergies, family history, immunizations, medications and social history were updated in the electronic medical record today. All recent labs, ED visits and hospitalizations within the last year were reviewed.  Health maintenance:  Colonoscopy:no fhx, cologuard completed 12/2019- 3 yr repeat Mammogram:Possible Breast cancer in [facility], but not sure.completed: 2021, biradspt reported normal.Dr. Arelia Sneddon office. Cervical cancer screening: last pap:2019,normal per patient. Completed by:Dr. McComb Immunizations: tdap DUE- declined for now, Influenza DUE - declined for now (encouraged yearly). Discussed shringix #1 today and #2 by nurse appt in 2-6 mos.  Infectious disease screening: HIVw/ pregnancy, declined repeat testing DEXA:screen at 60 Assistive device: none Oxygen QTM:AUQJ Patient has a Dental home. Hospitalizations/ED visits: reviewed  Elevated LFTs/Elevated hemoglobin (HCC)/macrocytosis Patient was seen August 13, 2017 and found to have elevated liver enzymes; ALT 63 and AST 58. Mildly abnormal liver enzymes with an ALT 39 and 2017.ID labs normal. B vitamins normal.  TSh normal.Reports she does consume alcohol approximately 2 drinks daily.Non-smoker.  She has now established with hematology.  Mixed hyperlipidemia/Overweight (BMI 25.0-29.9)/prediabetes Fhxof heart disease in her mother, AAA and her maternal grandfather, stroke in her paternal grandmother.  Compliance withZeita and fish oil.   Depression screen [facility] 2/9 06/19/2020 03/10/2019 04/09/2018 08/13/2017  Decreased Interest 0 1 0 0  Down, Depressed, Hopeless 0 0 0 0  PHQ - 2 Score 0 1 0 0  Altered sleeping - - 0 3  Tired, decreased energy - - 0 1  Change in appetite - - 0 0  Feeling bad or failure about yourself  - - 0 0  Trouble concentrating - - 0 0  Moving slowly or fidgety/restless - - 0 0  Suicidal thoughts - - 0 0  PHQ-9 Score - - 0 4  Difficult doing work/chores - - Not difficult at all Not difficult at all   GAD 7 : Generalized Anxiety Score 08/13/2017  Nervous, Anxious, on Edge 0  Control/stop worrying 0  Worry too much - different things 0  Trouble relaxing 0  Restless 0  Easily annoyed or irritable 0  Afraid - awful might happen 0  Total GAD 7 Score 0  Anxiety Difficulty Not difficult at all    Immunization History  Administered Date(s) Administered  . Influenza,inj,Quad PF,6+ Mos 01/16/2019  . Influenza-Unspecified 03/08/2011, 01/16/2019  . PFIZER(Purple Top)SARS-COV-2 Vaccination 08/12/2019, 09/06/2019  . Tdap 09/20/2008  . Zoster Recombinat (Shingrix) 06/19/2020   Past Medical History:  Diagnosis Date  . Allergic rhinitis 07/23/2007  . Chicken pox   . Chronic back pain 09/13/2010  . Depression 05/09/2010  . Dysmenorrhea 03/08/2011  . Insomnia 04/03/2010  . Rosacea 02/16/2010  . UTI (  urinary tract infection)   . Vitamin D deficiency 09/13/2010   No Known Allergies Past Surgical History:  Procedure Laterality Date  . DILATION AND CURETTAGE OF UTERUS     Family History  Problem Relation Age of Onset  . CAD Mother   . Heart attack Mother 23  . Heart  disease Mother   . Pulmonary fibrosis Mother   . Nephrolithiasis Father   . AAA (abdominal aortic aneurysm) Maternal Grandfather   . Stroke Paternal Grandmother   . Nephrolithiasis Paternal Grandfather    Social History   Social History Narrative   Married.  Children.   Bachelors in Occupational psychologist.  Works as a Warden/ranger.   Smoke alarms in the home.  Wears her seatbelt.   Feels safe in her relationships.    Allergies as of 06/19/2020   No Known Allergies     Medication List       Accurate as of June 19, 2020  3:32 PM. If you have any questions, ask your nurse or doctor.        clobetasol 0.05 % external solution Commonly known as: TEMOVATE Apply topically.   doxycycline 100 MG capsule Commonly known as: VIBRAMYCIN Take 100 mg by mouth daily.   ezetimibe 10 MG tablet Commonly known as: ZETIA Take 1 tablet (10 mg total) by mouth daily.   hydrochlorothiazide 12.5 MG capsule Commonly known as: Microzide Take 1 capsule (12.5 mg total) by mouth daily. Started by: Felix Pacini, DO   ibuprofen 800 MG tablet Commonly known as: ADVIL Take 800 mg by mouth every 8 (eight) hours as needed.   metroNIDAZOLE 1 % gel Commonly known as: METROGEL Apply 1 application topically daily.       All past medical history, surgical history, allergies, family history, immunizations andmedications were updated in the EMR today and reviewed under the history and medication portions of their EMR.     No results found for this or any previous visit (from the past 2160 hour(s)).   ROS: 14 pt review of systems performed and negative (unless mentioned in an HPI)  Objective: BP (!) 148/98   Pulse 79   Temp (!) 97.2 F (36.2 C) (Oral)   Ht 5\' 6"  (1.676 m)   Wt 171 lb (77.6 kg)   SpO2 99%   BMI 27.60 kg/m  Gen: Afebrile. No acute distress. Nontoxic in appearance, well-developed, well-nourished, pleasant female HENT: AT. [place]. Bilateral TM visualized and normal in appearance,  normal external auditory canal. MMM, no oral lesions, adequate dentition. Bilateral nares within normal limits. Throat without erythema, ulcerations or exudates.  No cough on exam, no hoarseness on exam. Eyes:Pupils Equal Round Reactive to light, Extraocular movements intact,  Conjunctiva without redness, discharge or icterus. Neck/lymp/endocrine: Supple, no lymphadenopathy, no thyromegaly CV: RRR no murmur, no edema, +2/4 P posterior tibialis pulses.  Chest: CTAB, no wheeze, rhonchi or crackles.  Normal respiratory effort.  Good air movement. Abd: Soft.  Flat. NTND. BS present.  No masses palpated. No hepatosplenomegaly. No rebound tenderness or guarding. Skin: No rashes, purpura or petechiae. Warm and well-perfused. Skin intact. Neuro/Msk:  Normal gait. PERLA. EOMi. Alert. Oriented x3.  Cranial nerves II through XII intact. Muscle strength 5/5 upper/lower extremity. DTRs equal bilaterally. Psych: Normal affect, dress and demeanor. Normal speech. Normal thought content and judgment.   No exam data present  Assessment/plan: Kristin Forbes is a 50 y.o. female present for CPE/CMC HTN/Mixed hyperlipidemia/Hepatic steatosis - new onset  Hyspertension/prehtn > elected to start HCTZ  12.5 mg.  - dietary modifications- routine exercise.  - caution on alcohol use.  -Continue Zetia -CBC, CMP, TSH, lipid panel collected today - [place] 12/2017 resulted with hepatic steatosis. Follow-up 5.5 months on chronic conditions  Vitamin D deficiency -Continue supplement 1000 units daily, patient will be guided on any changes needed to supplement dose after labs are received. -Vitamin D (25 hydroxy) collected today -Continue B12  Prediabetes Diet and exercise encouraged - Hemoglobin A1c  Need for shingles vaccine Shingrix No. 1 administered today.  Nurse visit in 2-6 months for Shingrix No. 2 to complete series  Encounter for general adult medical examination with abnormal findings Patient was  encouraged to exercise greater than 150 minutes a week. Patient was encouraged to choose a diet filled with fresh fruits and vegetables, and lean meats. AVS provided to patient today for education/recommendation on gender specific health and safety maintenance. Colonoscopy:no fhx, cologuard completed 12/2019- 3 yr repeat Mammogram:Possible Breast cancer in [place], but not sure.completed: 2021, biradspt reported normal.Dr. Arelia Sneddon office. Cervical cancer screening: last pap:2019,normal per patient. Completed by:Dr. McComb Immunizations: tdap DUE- declined for now, Influenza DUE - declined for now (encouraged yearly). Discussed shringix #1 today and #2 by nurse appt in 2-6 mos.  Infectious disease screening: HIVw/ pregnancy, declined repeat testing DEXA:screen at 60  Return in about 23 weeks (around 11/27/2020) for CMC (30 min).  Orders Placed This Encounter  Procedures  . Varicella-zoster vaccine IM  . CBC with Differential/Platelet  . Comprehensive metabolic panel  . Hemoglobin A1c  . Lipid panel  . TSH  . VITAMIN D 25 Hydroxy (Vit-D Deficiency, Fractures)    Orders Placed This Encounter  Procedures  . Varicella-zoster vaccine IM  . CBC with Differential/Platelet  . Comprehensive metabolic panel  . Hemoglobin A1c  . Lipid panel  . TSH  . VITAMIN D 25 Hydroxy (Vit-D Deficiency, Fractures)   Meds ordered this encounter  Medications  . ezetimibe (ZETIA) 10 MG tablet    Sig: Take 1 tablet (10 mg total) by mouth daily.    Dispense:  90 tablet    Refill:  1    May dispense in 90 d or 30 d- with year supply- if insurance does not allow 90 d at a time.  . hydrochlorothiazide (MICROZIDE) 12.5 MG capsule    Sig: Take 1 capsule (12.5 mg total) by mouth daily.    Dispense:  90 capsule    Refill:  1   Referral Orders  No referral(s) requested today     Electronically signed by: Felix Pacini, DO [facility] [place]

## 2020-06-19 NOTE — Patient Instructions (Signed)
3 mos - nurse appt for shingrix #2 5.5 mos for provider appt on blood pressure.  1 yr for physical   Start HCTZ in the morning every day.    Health Maintenance, Female Adopting a healthy lifestyle and getting preventive care are important in promoting health and wellness. Ask your health care provider about:  The right schedule for you to have regular tests and exams.  Things you can do on your own to prevent diseases and keep yourself healthy. What should I know about diet, weight, and exercise? Eat a healthy diet  Eat a diet that includes plenty of vegetables, fruits, low-fat dairy products, and lean protein.  Do not eat a lot of foods that are high in solid fats, added sugars, or sodium.   Maintain a healthy weight Body mass index (BMI) is used to identify weight problems. It estimates body fat based on height and weight. Your health care provider can help determine your BMI and help you achieve or maintain a healthy weight. Get regular exercise Get regular exercise. This is one of the most important things you can do for your health. Most adults should:  Exercise for at least 150 minutes each week. The exercise should increase your heart rate and make you sweat (moderate-intensity exercise).  Do strengthening exercises at least twice a week. This is in addition to the moderate-intensity exercise.  Spend less time sitting. Even light physical activity can be beneficial. Watch cholesterol and blood lipids Have your blood tested for lipids and cholesterol at 51 years of age, then have this test every 5 years. Have your cholesterol levels checked more often if:  Your lipid or cholesterol levels are high.  You are older than 51 years of age.  You are at high risk for heart disease. What should I know about cancer screening? Depending on your health history and family history, you may need to have cancer screening at various ages. This may include screening for:  Breast  cancer.  Cervical cancer.  Colorectal cancer.  Skin cancer.  Lung cancer. What should I know about heart disease, diabetes, and high blood pressure? Blood pressure and heart disease  High blood pressure causes heart disease and increases the risk of stroke. This is more likely to develop in people who have high blood pressure readings, are of African descent, or are overweight.  Have your blood pressure checked: ? Every 3-5 years if you are 29-70 years of age. ? Every year if you are 39 years old or older. Diabetes Have regular diabetes screenings. This checks your fasting blood sugar level. Have the screening done:  Once every three years after age 84 if you are at a normal weight and have a low risk for diabetes.  More often and at a younger age if you are overweight or have a high risk for diabetes. What should I know about preventing infection? Hepatitis B If you have a higher risk for hepatitis B, you should be screened for this virus. Talk with your health care provider to find out if you are at risk for hepatitis B infection. Hepatitis C Testing is recommended for:  Everyone born from [birth year] through 1966.  Anyone with known risk factors for hepatitis C. Sexually transmitted infections (STIs)  Get screened for STIs, including gonorrhea and chlamydia, if: ? You are sexually active and are younger than 51 years of age. ? You are older than 51 years of age and your health care provider tells you that you are  at risk for this type of infection. ? Your sexual activity has changed since you were last screened, and you are at increased risk for chlamydia or gonorrhea. Ask your health care provider if you are at risk.  Ask your health care provider about whether you are at high risk for HIV. Your health care provider may recommend a prescription medicine to help prevent HIV infection. If you choose to take medicine to prevent HIV, you should first get tested for HIV. You should  then be tested every 3 months for as long as you are taking the medicine. Pregnancy  If you are about to stop having your period (premenopausal) and you may become pregnant, seek counseling before you get pregnant.  Take 400 to 800 micrograms (mcg) of folic acid every day if you become pregnant.  Ask for birth control (contraception) if you want to prevent pregnancy. Osteoporosis and menopause Osteoporosis is a disease in which the bones lose minerals and strength with aging. This can result in bone fractures. If you are 71 years old or older, or if you are at risk for osteoporosis and fractures, ask your health care provider if you should:  Be screened for bone loss.  Take a calcium or vitamin D supplement to lower your risk of fractures.  Be given hormone replacement therapy (HRT) to treat symptoms of menopause. Follow these instructions at home: Lifestyle  Do not use any products that contain nicotine or tobacco, such as cigarettes, e-cigarettes, and chewing tobacco. If you need help quitting, ask your health care provider.  Do not use street drugs.  Do not share needles.  Ask your health care provider for help if you need support or information about quitting drugs. Alcohol use  Do not drink alcohol if: ? Your health care provider tells you not to drink. ? You are pregnant, may be pregnant, or are planning to become pregnant.  If you drink alcohol: ? Limit how much you use to 0-1 drink a day. ? Limit intake if you are breastfeeding.  Be aware of how much alcohol is in your drink. In the [place] one drink equals one 12 oz bottle of beer (355 mL), one 5 oz glass of wine (148 mL), or one 1 oz glass of hard liquor (44 mL). General instructions  Schedule regular health, dental, and eye exams.  Stay current with your vaccines.  Tell your health care provider if: ? You often feel depressed. ? You have ever been abused or do not feel safe at home. Summary  Adopting a  healthy lifestyle and getting preventive care are important in promoting health and wellness.  Follow your health care provider's instructions about healthy diet, exercising, and getting tested or screened for diseases.  Follow your health care provider's instructions on monitoring your cholesterol and blood pressure. This information is not intended to replace advice given to you by your health care provider. Make sure you discuss any questions you have with your health care provider. Document Revised: 04/29/2018 Document Reviewed: 04/29/2018 Elsevier Patient Education  2021 [employer].

## 2020-06-20 ENCOUNTER — Telehealth: Payer: Self-pay | Admitting: Family Medicine

## 2020-06-20 DIAGNOSIS — R7303 Prediabetes: Secondary | ICD-10-CM

## 2020-06-20 DIAGNOSIS — E782 Mixed hyperlipidemia: Secondary | ICD-10-CM

## 2020-06-20 NOTE — Telephone Encounter (Signed)
Please call patient -kidney and thyroid function are normal -Vitamin D levels are normal.  Would encourage her to take 1000 units daily of OTC vitamin D for bone health, or if she is currently taking vitamin D supplement add 1000 units.  Vitamin D is best absorbed when taken with a meal -Blood cell counts are stable and the size of her red blood cells has returned to normal.  -Continue B12 supplement Diabetes screening/A1c is in the prediabetic range.  No medications are needed at this time with an A1c of 6.2 (diabetes is greater than 6.5).  However increasing exercise to at least 150 minutes weekly of cardiovascular exercise and decreasing sugar content in diet can be helpful. Cholesterol panel is higher than last checked with a total cholesterol of 246, was 214.  With a bad cholesterol/LDL of 159, was 139.  And a mildly elevated triglycerides at 176 (goal less than 150).   -Please confirm that she is taking the Zetia and has been daily for at least 2 months prior to lab collection.   -With her elevated blood pressure, prediabetes and her family history of heart disease in her mother she is at high risk for heart attack or stroke within the next 10 years with the above cholesterol levels-per American Heart Association criteria.  She would benefit from starting a statin medication to help lower her cholesterol and provide her extra cardiovascular protection.   -If she would like to add the cardiovascular protection and help with her cholesterol levels I will call the statin in for her and we will recheck her cholesterol and prediabetes at her follow-up appointment in 5.5 months.    -If she is not willing to start a statin medication, I would encourage her to add a fiber supplement such as 2 servings of Metamucil to her daily regimen.  And of course regardless of medication or no medication exercise and low saturated fat diet avoiding butter, red meats and sweets will also help.

## 2020-06-20 NOTE — Telephone Encounter (Signed)
LVM for pt to CB regarding results.  

## 2020-06-21 ENCOUNTER — Encounter: Payer: Self-pay | Admitting: Family Medicine

## 2020-06-21 DIAGNOSIS — Z5320 Procedure and treatment not carried out because of patient's decision for unspecified reasons: Secondary | ICD-10-CM | POA: Insufficient documentation

## 2020-06-21 NOTE — Telephone Encounter (Signed)
LVM for pt to CB regarding results.  

## 2020-06-21 NOTE — Telephone Encounter (Signed)
Spoke with pt regarding labs and instructions. Pt declines Rx at this time will consider after next f/u

## 2020-08-01 DIAGNOSIS — Z01419 Encounter for gynecological examination (general) (routine) without abnormal findings: Secondary | ICD-10-CM | POA: Diagnosis not present

## 2020-08-01 DIAGNOSIS — Z6828 Body mass index (BMI) 28.0-28.9, adult: Secondary | ICD-10-CM | POA: Diagnosis not present

## 2020-08-01 DIAGNOSIS — Z1231 Encounter for screening mammogram for malignant neoplasm of breast: Secondary | ICD-10-CM | POA: Diagnosis not present

## 2020-08-01 LAB — HM MAMMOGRAPHY

## 2020-08-01 LAB — HM PAP SMEAR
HM Pap smear: NORMAL
Pap: NEGATIVE

## 2020-08-01 LAB — RESULTS CONSOLE HPV: CHL HPV: NEGATIVE

## 2020-08-04 LAB — HM MAMMOGRAPHY

## 2020-09-19 ENCOUNTER — Ambulatory Visit: Payer: BC Managed Care – PPO

## 2020-10-17 ENCOUNTER — Other Ambulatory Visit: Payer: Self-pay

## 2020-10-17 ENCOUNTER — Ambulatory Visit (INDEPENDENT_AMBULATORY_CARE_PROVIDER_SITE_OTHER): Payer: BC Managed Care – PPO

## 2020-10-17 DIAGNOSIS — Z23 Encounter for immunization: Secondary | ICD-10-CM | POA: Diagnosis not present

## 2020-12-11 ENCOUNTER — Ambulatory Visit: Payer: BC Managed Care – PPO | Admitting: Family Medicine

## 2020-12-22 ENCOUNTER — Ambulatory Visit: Payer: BC Managed Care – PPO | Admitting: Family Medicine

## 2021-01-15 ENCOUNTER — Ambulatory Visit: Payer: BC Managed Care – PPO | Admitting: Family Medicine

## 2021-01-15 DIAGNOSIS — E782 Mixed hyperlipidemia: Secondary | ICD-10-CM

## 2021-01-15 DIAGNOSIS — R7989 Other specified abnormal findings of blood chemistry: Secondary | ICD-10-CM

## 2021-01-17 ENCOUNTER — Encounter: Payer: Self-pay | Admitting: Family Medicine

## 2021-01-17 ENCOUNTER — Other Ambulatory Visit: Payer: Self-pay

## 2021-01-17 ENCOUNTER — Ambulatory Visit (INDEPENDENT_AMBULATORY_CARE_PROVIDER_SITE_OTHER): Payer: BC Managed Care – PPO | Admitting: Family Medicine

## 2021-01-17 VITALS — BP 123/85 | HR 85 | Temp 98.1°F | Ht 66.0 in | Wt 162.0 lb

## 2021-01-17 DIAGNOSIS — R7303 Prediabetes: Secondary | ICD-10-CM

## 2021-01-17 DIAGNOSIS — Z5320 Procedure and treatment not carried out because of patient's decision for unspecified reasons: Secondary | ICD-10-CM

## 2021-01-17 DIAGNOSIS — K760 Fatty (change of) liver, not elsewhere classified: Secondary | ICD-10-CM

## 2021-01-17 DIAGNOSIS — R7989 Other specified abnormal findings of blood chemistry: Secondary | ICD-10-CM | POA: Diagnosis not present

## 2021-01-17 DIAGNOSIS — F418 Other specified anxiety disorders: Secondary | ICD-10-CM

## 2021-01-17 DIAGNOSIS — G479 Sleep disorder, unspecified: Secondary | ICD-10-CM

## 2021-01-17 DIAGNOSIS — E782 Mixed hyperlipidemia: Secondary | ICD-10-CM

## 2021-01-17 DIAGNOSIS — I10 Essential (primary) hypertension: Secondary | ICD-10-CM

## 2021-01-17 DIAGNOSIS — T753XXA Motion sickness, initial encounter: Secondary | ICD-10-CM

## 2021-01-17 DIAGNOSIS — E559 Vitamin D deficiency, unspecified: Secondary | ICD-10-CM

## 2021-01-17 LAB — LIPID PANEL
Cholesterol: 223 mg/dL — ABNORMAL HIGH (ref 0–200)
HDL: 50.5 mg/dL (ref >39.00)
LDL Cholesterol: 151 mg/dL — ABNORMAL HIGH (ref 0–99)
NonHDL: 172.81
Total CHOL/HDL Ratio: 4
Triglycerides: 109 mg/dL (ref 0.0–149.0)
VLDL: 21.8 mg/dL (ref 0.0–40.0)

## 2021-01-17 LAB — HEMOGLOBIN A1C: Hgb A1c MFr Bld: 5.9 % (ref 4.6–6.5)

## 2021-01-17 LAB — HEPATIC FUNCTION PANEL
ALT: 76 U/L — ABNORMAL HIGH (ref 0–35)
AST: 109 U/L — ABNORMAL HIGH (ref 0–37)
Albumin: 4.4 g/dL (ref 3.5–5.2)
Alkaline Phosphatase: 59 U/L (ref 39–117)
Bilirubin, Direct: 0.2 mg/dL (ref 0.0–0.3)
Total Bilirubin: 1 mg/dL (ref 0.2–1.2)
Total Protein: 7.7 g/dL (ref 6.0–8.3)

## 2021-01-17 MED ORDER — EZETIMIBE 10 MG PO TABS: 10.0000 mg | ORAL_TABLET | Freq: Every day | ORAL | 3 refills | Status: DC

## 2021-01-17 MED ORDER — TRAZODONE HCL 50 MG PO TABS: 25.0000 mg | ORAL_TABLET | Freq: Every evening | ORAL | 0 refills | Status: DC | PRN

## 2021-01-17 MED ORDER — SCOPOLAMINE 1 MG/3DAYS TD PT72: 1.0000 | MEDICATED_PATCH | TRANSDERMAL | 0 refills | Status: DC

## 2021-01-17 MED ORDER — HYDROCHLOROTHIAZIDE 12.5 MG PO CAPS: 12.5000 mg | ORAL_CAPSULE | Freq: Every day | ORAL | 1 refills | Status: DC

## 2021-01-17 NOTE — Progress Notes (Signed)
This visit occurred during the SARS-CoV-2 public health emergency.  Safety protocols were in place, including screening questions prior to the visit, additional usage of staff PPE, and extensive cleaning of exam room while observing appropriate contact time as indicated for disinfecting solutions.    Patient ID: Kristin Forbes, female  DOB: [birth year], 50 y.o.   MRN: [id number] Patient Care Team    Relationship Specialty Notifications Start End  Natalia Leatherwood, DO PCP - General Family Medicine  08/13/17   Ginette Otto, [facility]    08/13/17   Arminda Resides, MD Consulting Physician Dermatology  08/13/17     Chief Complaint  Patient presents with   Hyperlipidemia    CMC; pt is fasting     Subjective: Kristin Forbes is a 50 y.o.  Female  present for  Elevated LFTs/Elevated hemoglobin (HCC)/macrocytosis Patient was seen August 13, 2017 and found to have elevated liver enzymes; ALT 63 and AST 58.  Mildly abnormal liver enzymes with an ALT 39 and 2017. ID labs normal. B vitamins normal. TSh normal.  Reports she does consume alcohol approximately 2 drinks daily.  Non-smoker.  She was referred to hematology for further intervention.   Hypertension/mixed hyperlipidemia/Overweight (BMI 25.0-29.9)/prediabetes Fhx  of heart disease in her mother, AAA and her maternal grandfather, stroke in her paternal grandmother.  Compliance with Zeita and fish oil.  Declines statin Patient reports compliance with HCTZ 12.5 mg daily.  She states she feels this is working really well for her and her blood pressures have improved.  Anxiety: Patient presents today and states that she is having worsening anxiety and feeling overwhelmed.  She is uncertain the cause.  She left her job approximately a year ago, and was hoping to start to enjoy life more.  Her daughter is a senior this year she is uncertain if she is feeling some emotional secondary to that.  She states she does not really feel depressed.   She has boxes that she needs to unpack secondary to the move that she states she is not motivated to complete.  She knows there is things in the boxes from her mother's passing that she does not want to see so she has been putting it off and she thinks that is bothering her.  She says recently everything that is even small just seems to be really big and affect her.  She had been on Cymbalta in the past and has not seen a therapist.  She states the Cymbalta made her have suicidal thoughts and made her worse.  She did not feel the therapist was helpful.  She also reports she is not sleeping well.    Depression screen [facility] 2/9 01/17/2021 06/19/2020 03/10/2019 04/09/2018 08/13/2017  Decreased Interest 3 0 1 0 0  Down, Depressed, Hopeless 1 0 0 0 0  PHQ - 2 Score 4 0 1 0 0  Altered sleeping 3 - - 0 3  Tired, decreased energy 3 - - 0 1  Change in appetite 0 - - 0 0  Feeling bad or failure about yourself  1 - - 0 0  Trouble concentrating 3 - - 0 0  Moving slowly or fidgety/restless 1 - - 0 0  Suicidal thoughts 0 - - 0 0  PHQ-9 Score 15 - - 0 4  Difficult doing work/chores - - - Not difficult at all Not difficult at all   GAD 7 : Generalized Anxiety Score 01/17/2021 08/13/2017  Nervous, Anxious, on Edge 1 0  Control/stop worrying 3 0  Worry too much - different things 3 0  Trouble relaxing 2 0  Restless 0 0  Easily annoyed or irritable 2 0  Afraid - awful might happen 3 0  Total GAD 7 Score 14 0  Anxiety Difficulty - Not difficult at all    Immunization History  Administered Date(s) Administered   Influenza,inj,Quad PF,6+ Mos 01/16/2019   Influenza-Unspecified 03/08/2011, 01/16/2019   PFIZER(Purple Top)SARS-COV-2 Vaccination 08/12/2019, 09/06/2019   Tdap 09/20/2008   Zoster Recombinat (Shingrix) 06/19/2020, 10/17/2020   Past Medical History:  Diagnosis Date   Allergic rhinitis 07/23/2007   Chicken pox    Chronic back pain 09/13/2010   Depression 05/09/2010   Dysmenorrhea 03/08/2011    Insomnia 04/03/2010   Rosacea 02/16/2010   UTI (urinary tract infection)    Vitamin D deficiency 09/13/2010   No Known Allergies Past Surgical History:  Procedure Laterality Date   DILATION AND CURETTAGE OF UTERUS     Family History  Problem Relation Age of Onset   CAD Mother    Heart attack Mother 24   Heart disease Mother    Pulmonary fibrosis Mother    Nephrolithiasis Father    AAA (abdominal aortic aneurysm) Maternal Grandfather    Stroke Paternal Grandmother    Nephrolithiasis Paternal Grandfather    Social History   Social History Narrative   Married.  Children.   Bachelors in Occupational psychologist.  Works as a Warden/ranger.   Smoke alarms in the home.  Wears her seatbelt.   Feels safe in her relationships.    Allergies as of 01/17/2021   No Known Allergies      Medication List        Accurate as of January 17, 2021 11:59 PM. If you have any questions, ask your nurse or doctor.          clobetasol 0.05 % external solution Commonly known as: TEMOVATE Apply topically.   doxycycline 100 MG capsule Commonly known as: VIBRAMYCIN Take 100 mg by mouth daily.   ezetimibe 10 MG tablet Commonly known as: ZETIA Take 1 tablet (10 mg total) by mouth daily.   hydrochlorothiazide 12.5 MG capsule Commonly known as: Microzide Take 1 capsule (12.5 mg total) by mouth daily.   ibuprofen 800 MG tablet Commonly known as: ADVIL Take 800 mg by mouth every 8 (eight) hours as needed.   metroNIDAZOLE 1 % gel Commonly known as: METROGEL Apply 1 application topically daily.   scopolamine 1 MG/3DAYS Commonly known as: Transderm-Scop (1.5 MG) Place 1 patch (1.5 mg total) onto the skin every 3 (three) days. Started by: Felix Pacini, DO   traZODone 50 MG tablet Commonly known as: DESYREL Take 0.5-1.5 tablets (25-75 mg total) by mouth at bedtime as needed for sleep. Started by: Felix Pacini, DO        All past medical history, surgical history, allergies, family history,  immunizations andmedications were updated in the EMR today and reviewed under the history and medication portions of their EMR.     Recent Results (from the past 2160 hour(s))  Hemoglobin A1c     Status: None   Collection Time: 01/17/21 11:47 AM  Result Value Ref Range   Hgb A1c MFr Bld 5.9 4.6 - 6.5 %    Comment: Glycemic Control Guidelines for People with Diabetes:Non Diabetic:  <6%Goal of Therapy: <7%Additional Action Suggested:  >8%   Lipid panel     Status: Abnormal   Collection Time: 01/17/21 11:47 AM  Result Value  Ref Range   Cholesterol 223 (H) 0 - 200 mg/dL    Comment: ATP III Classification       Desirable:  < 200 mg/dL               Borderline High:  200 - 239 mg/dL          High:  > = 937 mg/dL   Triglycerides 169.6 0.0 - 149.0 mg/dL    Comment: Normal:  <789 mg/dLBorderline High:  150 - 199 mg/dL   HDL 38.10 >17.51 mg/dL   VLDL 02.5 0.0 - 85.2 mg/dL   LDL Cholesterol 778 (H) 0 - 99 mg/dL   Total CHOL/HDL Ratio 4     Comment:                Men          Women1/2 Average Risk     3.4          3.3Average Risk          5.0          4.42X Average Risk          9.6          7.13X Average Risk          15.0          11.0                       NonHDL 172.81     Comment: NOTE:  Non-HDL goal should be 30 mg/dL higher than patient's LDL goal (i.e. LDL goal of < 70 mg/dL, would have non-HDL goal of < 100 mg/dL)  Hepatic function panel     Status: Abnormal   Collection Time: 01/17/21 11:47 AM  Result Value Ref Range   Total Bilirubin 1.0 0.2 - 1.2 mg/dL   Bilirubin, Direct 0.2 0.0 - 0.3 mg/dL   Alkaline Phosphatase 59 39 - 117 U/L   AST 109 (H) 0 - 37 U/L   ALT 76 (H) 0 - 35 U/L   Total Protein 7.7 6.0 - 8.3 g/dL   Albumin 4.4 3.5 - 5.2 g/dL     ROS: 14 pt review of systems performed and negative (unless mentioned in an HPI)  Objective: BP 123/85   Pulse 85   Temp 98.1 F (36.7 C) (Rectal)   Ht 5\' 6"  (1.676 m)   Wt 162 lb (73.5 kg)   SpO2 96%   BMI 26.15 kg/m  Gen:  Afebrile. No acute distress.  Pleasant female HENT: AT. [place].  No cough.  No hoarseness. Eyes:Pupils Equal Round Reactive to light, Extraocular movements intact,  Conjunctiva without redness, discharge or icterus. Neck/lymp/endocrine: Supple, no lymphadenopathy, no thyromegaly CV: RRR no murmur, no edema, +2/4 P posterior tibialis pulses Chest: CTAB, no wheeze or crackles Neuro:  Normal gait. PERLA. EOMi. Alert. Oriented. x3 Psych: Normal affect, dress and demeanor. Normal speech. Normal thought content and judgment.  No results found.  Assessment/plan: Adreena Willits is a 50 y.o. female present for [facility] HTN/Mixed hyperlipidemia/Hepatic steatosis/elevated LFTs-statin declined Stable. - continue HCTZ 12.5 mg.  - dietary modifications- routine exercise.  - caution on alcohol use.  -Continue Zetia -Statin declined -CBC and lipids collected today - [facility] 12/2017 resulted with hepatic steatosis.  Follow-up 5.5 months on chronic conditions   Vitamin D deficiency -Continue supplement 1000 units daily, patient will be guided on any changes needed to supplement dose after labs are received. -Vitamin D (25 hydroxy)  collected today -Continue B12  Prediabetes Patient reports she has made changes to her diet. A1c collected today  Sleep disturbance/Depression with anxiety New Discussed different options for treatment plan with her today. She declined referral to psychology. She would like to start trazodone taper. Follow-up in 4 weeks.  Seasickness: New Scopolamine patch was prescribed.  Patient was educated on proper use.  Return in about 4 weeks (around 02/14/2021) for depression.  Orders Placed This Encounter  Procedures   Hemoglobin A1c   Lipid panel   Hepatic function panel   Meds ordered this encounter  Medications   ezetimibe (ZETIA) 10 MG tablet    Sig: Take 1 tablet (10 mg total) by mouth daily.    Dispense:  90 tablet    Refill:  3    May dispense in 90 d or 30 d-  with year supply- if insurance does not allow 90 d at a time.   hydrochlorothiazide (MICROZIDE) 12.5 MG capsule    Sig: Take 1 capsule (12.5 mg total) by mouth daily.    Dispense:  90 capsule    Refill:  1   traZODone (DESYREL) 50 MG tablet    Sig: Take 0.5-1.5 tablets (25-75 mg total) by mouth at bedtime as needed for sleep.    Dispense:  45 tablet    Refill:  0   scopolamine (TRANSDERM-SCOP, 1.5 MG,) 1 MG/3DAYS    Sig: Place 1 patch (1.5 mg total) onto the skin every 3 (three) days.    Dispense:  4 patch    Refill:  0    Referral Orders  No referral(s) requested today   > 45 minutes was dedicated to this patient's encounter to include face-to-face time with patient, covering multiple chronic conditions and to new conditions, and post-visit work- which include documentation and prescribing medications and/or ordering test when necessary.     Electronically signed by: Felix Pacinienee Starasia Sinko, DO [facility]

## 2021-01-17 NOTE — Patient Instructions (Addendum)
Great to see you today.  I have refilled the medication(s) we provide.   If labs were collected, we will inform you of lab results once received either by echart message or telephone call.   - echart message- for normal results that have been seen by the patient already.   - telephone call: abnormal results or if patient has not viewed results in their echart.   Managing Depression, Adult Depression is a mental health condition that affects your thoughts, feelings, and actions. Being diagnosed with depression can bring you relief if you did not know why you have felt or behaved a certain way. It could also leave you feeling overwhelmed with uncertainty about your future. Preparing yourself to manage your symptoms can help you feel more positive about your future. How to manage lifestyle changes Managing stress Stress is your body's reaction to life changes and events, both good and bad. Stress can add to your feelings of depression. Learning to manage your stress can help lessen your feelings of depression. Try some of the following approaches to reducing your stress (stress reduction techniques): Listen to music that you enjoy and that inspires you. Try using a meditation app or take a meditation class. Develop a practice that helps you connect with your spiritual self. Walk in nature, pray, or go to a place of worship. Do some deep breathing. To do this, inhale slowly through your nose. Pause at the top of your inhale for a few seconds and then exhale slowly, letting your muscles relax. Practice yoga to help relax and work your muscles. Choose a stress reduction technique that suits your lifestyle and personality. These techniques take time and practice to develop. Set aside 5-15 minutes a day to do them. Therapists can offer training in these techniques. Other things you can do to manage stress include: Keeping a stress diary. Knowing your limits and saying no when you think something is too  much. Paying attention to how you react to certain situations. You may not be able to control everything, but you can change your reaction. Adding humor to your life by watching funny films or TV shows. Making time for activities that you enjoy and that relax you.  Medicines Medicines, such as antidepressants, are often a part of treatment for depression. Talk with your pharmacist or health care provider about all the medicines, supplements, and herbal products that you take, their possible side effects, and what medicines and other products are safe to take together. Make sure to report any side effects you may have to your health care provider. Relationships Your health care provider may suggest family therapy, couples therapy, or individual therapy as part of your treatment. How to recognize changes Everyone responds differently to treatment for depression. As you recover from depression, you may start to: Have more interest in doing activities. Feel less hopeless. Have more energy. Overeat less often, or have a better appetite. Have better mental focus. It is important to recognize if your depression is not getting better or is getting worse. The symptoms you had in the beginning may return, such as: Tiredness (fatigue) or low energy. Eating too much or too little. Sleeping too much or too little. Feeling restless, agitated, or hopeless. Trouble focusing or making decisions. Unexplained physical complaints. Feeling irritable, angry, or aggressive. If you or your family members notice these symptoms coming back, let your health care provider know right away. Follow these instructions at home: Activity  Try to get some form of exercise  each day, such as walking, biking, swimming, or lifting weights. Practice stress reduction techniques. Engage your mind by taking a class or doing some volunteer work. Lifestyle Get the right amount and quality of sleep. Cut down on using caffeine,  tobacco, alcohol, and other potentially harmful substances. Eat a healthy diet that includes plenty of vegetables, fruits, whole grains, low-fat dairy products, and lean protein. Do not eat a lot of foods that are high in solid fats, added sugars, or salt (sodium). General instructions Take over-the-counter and prescription medicines only as told by your health care provider. Keep all follow-up visits as told by your health care provider. This is important. Where to find support Talking to others Friends and family members can be sources of support and guidance. Talk to trusted friends or family members about your condition. Explain your symptoms to them, and let them know that you are working with a health care provider to treat your depression. Tell friends and family members how they also can be helpful. Finances Find appropriate mental health providers that fit with your financial situation. Talk with your health care provider about options to get reduced prices on your medicines. Where to find more information You can find support in your area from: Anxiety and Depression Association of America (ADAA): www.adaa.org Mental Health America: www.mentalhealthamerica.net [employer] on Mental Illness: www.nami.org Contact a health care provider if: You stop taking your antidepressant medicines, and you have any of these symptoms: Nausea. Headache. Light-headedness. Chills and body aches. Not being able to sleep (insomnia). You or your friends and family think your depression is getting worse. Get help right away if: You have thoughts of hurting yourself or others. If you ever feel like you may hurt yourself or others, or have thoughts about taking your own life, get help right away. Go to your nearest emergency department or: Call your local emergency services (911 in the [place] Call a suicide crisis helpline, such as the National Suicide Prevention Lifeline at 667-036-7369. This  is open 24 hours a day in the [place] Text the Crisis Text Line at 450-719-6594 (in the [place] Summary If you are diagnosed with depression, preparing yourself to manage your symptoms is a good way to feel positive about your future. Work with your health care provider on a management plan that includes stress reduction techniques, medicines (if applicable), therapy, and healthy lifestyle habits. Keep talking with your health care provider about how your treatment is working. If you have thoughts about taking your own life, call a suicide crisis helpline or text a crisis text line. This information is not intended to replace advice given to you by your health care provider. Make sure you discuss any questions you have with your health care provider. Document Revised: 03/17/2019 Document Reviewed: 03/17/2019 Elsevier Patient Education  2022 [employer].

## 2021-01-18 ENCOUNTER — Telehealth: Payer: Self-pay | Admitting: Family Medicine

## 2021-01-18 DIAGNOSIS — F418 Other specified anxiety disorders: Secondary | ICD-10-CM | POA: Insufficient documentation

## 2021-01-18 DIAGNOSIS — G479 Sleep disorder, unspecified: Secondary | ICD-10-CM | POA: Insufficient documentation

## 2021-01-18 DIAGNOSIS — R7989 Other specified abnormal findings of blood chemistry: Secondary | ICD-10-CM

## 2021-01-18 HISTORY — DX: Other specified anxiety disorders: F41.8

## 2021-01-18 NOTE — Telephone Encounter (Signed)
Spoke with patient regarding results/recommendations,voiced understanding.  

## 2021-01-18 NOTE — Telephone Encounter (Signed)
Please inform patient Her liver enzymes have increased.  Her AST is now 109 and her ALT is 76.  This is an increase from a level of 61 for both AST/ALT in January. Her cholesterol/LDL is the same at 151.  Low saturated fat diet, avoiding butters, unhealthy oils, fatty meats and red meats, Low sugar and alcohol recommended. Her A1c has decreased back down to 5.9, this is in normal range.  Since her liver enzymes have continued to worsen.  Referral to gastroenterology is necessary for further evaluation.

## 2021-02-07 ENCOUNTER — Encounter: Payer: Self-pay | Admitting: Family Medicine

## 2021-02-07 MED ORDER — MIRTAZAPINE 15 MG PO TABS: 15.0000 mg | ORAL_TABLET | Freq: Every day | ORAL | 0 refills | Status: DC

## 2021-02-07 NOTE — Telephone Encounter (Signed)
Please advise 

## 2021-02-07 NOTE — Telephone Encounter (Signed)
Please inform pt sorry to hear trazodone is not working well for her and given her headache. Unfortunately, Wellbutrin is not used for all of the symptoms were trying to cover for her, including sleep disturbance.  It can also increase anxiety in some patients.  I would also want to avoid use of this drug class since she  had the suicidal ideations from Cymbalta use in the past.  I have called in a new medicine called Remeron/mirtazapine.  This medication is also taken approximately 1 hour before bed.  I would recommend she start with a half of a tab for the first night, and then can increase to 1 tab nightly.

## 2021-02-08 NOTE — Telephone Encounter (Signed)
Please advise on new message below.

## 2021-02-09 ENCOUNTER — Other Ambulatory Visit: Payer: Self-pay | Admitting: Family Medicine

## 2021-02-14 ENCOUNTER — Ambulatory Visit: Payer: BC Managed Care – PPO | Admitting: Family Medicine

## 2021-03-06 ENCOUNTER — Other Ambulatory Visit: Payer: Self-pay | Admitting: Family Medicine

## 2021-03-14 ENCOUNTER — Ambulatory Visit: Payer: BC Managed Care – PPO | Admitting: Family Medicine

## 2021-07-05 ENCOUNTER — Ambulatory Visit: Payer: BC Managed Care – PPO | Admitting: Family Medicine

## 2021-09-13 ENCOUNTER — Ambulatory Visit: Payer: BC Managed Care – PPO | Admitting: Family Medicine

## 2021-11-01 DIAGNOSIS — L718 Other rosacea: Secondary | ICD-10-CM | POA: Diagnosis not present

## 2021-11-01 DIAGNOSIS — L281 Prurigo nodularis: Secondary | ICD-10-CM | POA: Diagnosis not present

## 2021-12-06 ENCOUNTER — Other Ambulatory Visit: Payer: Self-pay

## 2021-12-12 ENCOUNTER — Other Ambulatory Visit: Payer: Self-pay

## 2021-12-12 MED ORDER — HYDROCHLOROTHIAZIDE 12.5 MG PO CAPS: 12.5000 mg | ORAL_CAPSULE | Freq: Every day | ORAL | 0 refills | Status: DC

## 2022-01-10 ENCOUNTER — Other Ambulatory Visit: Payer: Self-pay | Admitting: Family Medicine

## 2022-02-12 DIAGNOSIS — D485 Neoplasm of uncertain behavior of skin: Secondary | ICD-10-CM | POA: Diagnosis not present

## 2022-03-19 ENCOUNTER — Other Ambulatory Visit: Payer: Self-pay

## 2022-03-19 DIAGNOSIS — E782 Mixed hyperlipidemia: Secondary | ICD-10-CM

## 2022-03-19 DIAGNOSIS — R7989 Other specified abnormal findings of blood chemistry: Secondary | ICD-10-CM

## 2022-03-22 ENCOUNTER — Other Ambulatory Visit: Payer: Self-pay

## 2022-03-22 DIAGNOSIS — E782 Mixed hyperlipidemia: Secondary | ICD-10-CM

## 2022-03-22 DIAGNOSIS — R7989 Other specified abnormal findings of blood chemistry: Secondary | ICD-10-CM

## 2022-04-18 DIAGNOSIS — D485 Neoplasm of uncertain behavior of skin: Secondary | ICD-10-CM | POA: Diagnosis not present

## 2022-04-23 DIAGNOSIS — H0261 Xanthelasma of right upper eyelid: Secondary | ICD-10-CM | POA: Diagnosis not present

## 2022-04-23 DIAGNOSIS — H0264 Xanthelasma of left upper eyelid: Secondary | ICD-10-CM | POA: Diagnosis not present

## 2022-09-20 ENCOUNTER — Other Ambulatory Visit: Payer: Self-pay | Admitting: Family Medicine

## 2022-09-24 DIAGNOSIS — H0260 Xanthelasma of unspecified eye, unspecified eyelid: Secondary | ICD-10-CM | POA: Diagnosis not present

## 2022-10-21 DIAGNOSIS — M79644 Pain in right finger(s): Secondary | ICD-10-CM | POA: Diagnosis not present

## 2022-10-24 ENCOUNTER — Ambulatory Visit (INDEPENDENT_AMBULATORY_CARE_PROVIDER_SITE_OTHER): Payer: BC Managed Care – PPO | Admitting: Family Medicine

## 2022-10-24 ENCOUNTER — Encounter: Payer: Self-pay | Admitting: Family Medicine

## 2022-10-24 VITALS — BP 112/78 | HR 67 | Temp 97.4°F | Ht 66.5 in | Wt 167.6 lb

## 2022-10-24 DIAGNOSIS — E782 Mixed hyperlipidemia: Secondary | ICD-10-CM | POA: Diagnosis not present

## 2022-10-24 DIAGNOSIS — Z23 Encounter for immunization: Secondary | ICD-10-CM

## 2022-10-24 DIAGNOSIS — T753XXA Motion sickness, initial encounter: Secondary | ICD-10-CM

## 2022-10-24 DIAGNOSIS — E559 Vitamin D deficiency, unspecified: Secondary | ICD-10-CM

## 2022-10-24 DIAGNOSIS — Z5320 Procedure and treatment not carried out because of patient's decision for unspecified reasons: Secondary | ICD-10-CM

## 2022-10-24 DIAGNOSIS — R7989 Other specified abnormal findings of blood chemistry: Secondary | ICD-10-CM

## 2022-10-24 DIAGNOSIS — Z1211 Encounter for screening for malignant neoplasm of colon: Secondary | ICD-10-CM

## 2022-10-24 DIAGNOSIS — G479 Sleep disorder, unspecified: Secondary | ICD-10-CM | POA: Diagnosis not present

## 2022-10-24 DIAGNOSIS — Z131 Encounter for screening for diabetes mellitus: Secondary | ICD-10-CM | POA: Diagnosis not present

## 2022-10-24 DIAGNOSIS — Z0001 Encounter for general adult medical examination with abnormal findings: Secondary | ICD-10-CM | POA: Diagnosis not present

## 2022-10-24 DIAGNOSIS — I10 Essential (primary) hypertension: Secondary | ICD-10-CM

## 2022-10-24 DIAGNOSIS — Z0000 Encounter for general adult medical examination without abnormal findings: Secondary | ICD-10-CM

## 2022-10-24 DIAGNOSIS — F40243 Fear of flying: Secondary | ICD-10-CM

## 2022-10-24 DIAGNOSIS — F419 Anxiety disorder, unspecified: Secondary | ICD-10-CM

## 2022-10-24 LAB — COMPREHENSIVE METABOLIC PANEL
ALT: 69 U/L — ABNORMAL HIGH (ref 0–35)
AST: 65 U/L — ABNORMAL HIGH (ref 0–37)
Albumin: 4.6 g/dL (ref 3.5–5.2)
Alkaline Phosphatase: 74 U/L (ref 39–117)
BUN: 11 mg/dL (ref 6–23)
CO2: 28 mEq/L (ref 19–32)
Calcium: 9.7 mg/dL (ref 8.4–10.5)
Chloride: 97 mEq/L (ref 96–112)
Creatinine, Ser: 0.89 mg/dL (ref 0.40–1.20)
GFR: 74.19 mL/min (ref >60.00)
Glucose, Bld: 110 mg/dL — ABNORMAL HIGH (ref 70–99)
Potassium: 3.8 mEq/L (ref 3.5–5.1)
Sodium: 140 mEq/L (ref 135–145)
Total Bilirubin: 0.7 mg/dL (ref 0.2–1.2)
Total Protein: 8.3 g/dL (ref 6.0–8.3)

## 2022-10-24 LAB — LIPID PANEL
Cholesterol: 231 mg/dL — ABNORMAL HIGH (ref 0–200)
HDL: 51.2 mg/dL (ref >39.00)
LDL Cholesterol: 153 mg/dL — ABNORMAL HIGH (ref 0–99)
NonHDL: 179.58
Total CHOL/HDL Ratio: 5
Triglycerides: 132 mg/dL (ref 0.0–149.0)
VLDL: 26.4 mg/dL (ref 0.0–40.0)

## 2022-10-24 LAB — CBC WITH DIFFERENTIAL/PLATELET
Basophils Absolute: 0.1 10*3/uL (ref 0.0–0.1)
Basophils Relative: 1.6 % (ref 0.0–3.0)
Eosinophils Absolute: 0.2 10*3/uL (ref 0.0–0.7)
Eosinophils Relative: 3.8 % (ref 0.0–5.0)
HCT: 49.2 % — ABNORMAL HIGH (ref 36.0–46.0)
Hemoglobin: 16.4 g/dL — ABNORMAL HIGH (ref 12.0–15.0)
Lymphocytes Relative: 27.1 % (ref 12.0–46.0)
Lymphs Abs: 1.8 10*3/uL (ref 0.7–4.0)
MCHC: 33.4 g/dL (ref 30.0–36.0)
MCV: 102.7 fl — ABNORMAL HIGH (ref 78.0–100.0)
Monocytes Absolute: 0.6 10*3/uL (ref 0.1–1.0)
Monocytes Relative: 8.8 % (ref 3.0–12.0)
Neutro Abs: 3.8 10*3/uL (ref 1.4–7.7)
Neutrophils Relative %: 58.7 % (ref 43.0–77.0)
Platelets: 221 10*3/uL (ref 150.0–400.0)
RBC: 4.79 Mil/uL (ref 3.87–5.11)
RDW: 12.7 % (ref 11.5–15.5)
WBC: 6.5 10*3/uL (ref 4.0–10.5)

## 2022-10-24 LAB — TSH: TSH: 3.96 u[IU]/mL (ref 0.35–5.50)

## 2022-10-24 LAB — VITAMIN D 25 HYDROXY (VIT D DEFICIENCY, FRACTURES): VITD: 28.94 ng/mL — ABNORMAL LOW (ref 30.00–100.00)

## 2022-10-24 LAB — HEMOGLOBIN A1C: Hgb A1c MFr Bld: 6.3 % (ref 4.6–6.5)

## 2022-10-24 MED ORDER — HYDROCHLOROTHIAZIDE 12.5 MG PO CAPS: 12.5000 mg | ORAL_CAPSULE | Freq: Every day | ORAL | 1 refills | Status: AC

## 2022-10-24 MED ORDER — LORAZEPAM 0.5 MG PO TABS: ORAL_TABLET | ORAL | 0 refills | Status: AC

## 2022-10-24 MED ORDER — SCOPOLAMINE 1 MG/3DAYS TD PT72: 1.0000 | MEDICATED_PATCH | TRANSDERMAL | 0 refills | Status: AC

## 2022-10-24 MED ORDER — EZETIMIBE 10 MG PO TABS: 10.0000 mg | ORAL_TABLET | Freq: Every day | ORAL | 3 refills | Status: DC

## 2022-10-24 NOTE — Patient Instructions (Addendum)
Return in about 1 year (around 10/25/2023) for cpe (20 min).        Great to see you today.  I have refilled the medication(s) we provide.   If labs were collected, we will inform you of lab results once received either by echart message or telephone call.   - echart message- for normal results that have been seen by the patient already.   - telephone call: abnormal results or if patient has not viewed results in their echart.

## 2022-10-24 NOTE — Progress Notes (Signed)
Patient ID: Kristin Forbes, female  DOB: Mar 16, 1971, 52 y.o.   MRN: [id number] Patient Care Team    Relationship Specialty Notifications Start End  Natalia Leatherwood, DO PCP - General Family Medicine  08/13/17   Ginette Otto, [facility]    08/13/17   Arminda Resides, MD Consulting Physician Dermatology  08/13/17     Chief Complaint  Patient presents with   Annual Exam    Subjective: Kristin Forbes is a 52 y.o.  Female  present for CPE and acute concern. All past medical history, surgical history, allergies, family history, immunizations, medications and social history were updated in the electronic medical record today. All recent labs, ED visits and hospitalizations within the last year were reviewed.  Health maintenance:  Colonoscopy: no fhx, cologuard completed 12/2019- 3 yr repeat Mammogram: Possible Breast cancer in [place], but not sure. completed: 2023, birads pt reported normal. Dr. Arelia Sneddon office.  Records requested Cervical cancer screening: last pap: 2019, normal per patient.  Completed by: Dr. Arelia Sneddon appt 11/06/2022 Immunizations: tdap DUE- wants wait until after vaccination, Influenza-declined for now (encouraged yearly). shringix completed Infectious disease screening: HIV w/ pregnancy, declined repeat testing DEXA: screen at 36 Assistive device: none Oxygen VOZ:DGUY Patient has a Dental home. Hospitalizations/ED visits: reviewed  Elevated LFTs/Elevated hemoglobin (HCC)/macrocytosis Patient was seen August 13, 2017 and found to have elevated liver enzymes; ALT 63 and AST 58.  Mildly abnormal liver enzymes with an ALT 39 and 2017. ID labs normal. B vitamins normal. TSh normal.  Reports she does consume alcohol approximately 2 drinks daily.  Non-smoker.  She had an establishment appointment with hematology   Mixed hyperlipidemia/Overweight (BMI 25.0-29.9)/prediabetes Fhx  of heart disease in her mother, AAA and her maternal grandfather, stroke in her paternal  grandmother.  Compliance with Zeita and fish oil.      10/24/2022    9:40 AM 01/17/2021   11:51 AM 06/19/2020   10:07 AM 03/10/2019    9:02 AM 04/09/2018    8:05 AM  Depression screen PHQ 2/9  Decreased Interest 0 3 0 1 0  Down, Depressed, Hopeless 0 1 0 0 0  PHQ - 2 Score 0 4 0 1 0  Altered sleeping  3   0  Tired, decreased energy  3   0  Change in appetite  0   0  Feeling bad or failure about yourself   1   0  Trouble concentrating  3   0  Moving slowly or fidgety/restless  1   0  Suicidal thoughts  0   0  PHQ-9 Score  15   0  Difficult doing work/chores     Not difficult at all      01/17/2021   11:51 AM 08/13/2017    8:57 AM  GAD 7 : Generalized Anxiety Score  Nervous, Anxious, on Edge 1 0  Control/stop worrying 3 0  Worry too much - different things 3 0  Trouble relaxing 2 0  Restless 0 0  Easily annoyed or irritable 2 0  Afraid - awful might happen 3 0  Total GAD 7 Score 14 0  Anxiety Difficulty  Not difficult at all    Immunization History  Administered Date(s) Administered   Influenza,inj,Quad PF,6+ Mos 01/16/2019   Influenza-Unspecified 03/08/2011, 01/16/2019, 02/18/2019   PFIZER(Purple Top)SARS-COV-2 Vaccination 08/12/2019, 09/06/2019   Tdap 09/20/2008   Zoster Recombinat (Shingrix) 06/19/2020, 10/17/2020   Zoster, Live 06/20/2020   Past Medical History:  Diagnosis Date  Allergic rhinitis 07/23/2007   Chicken pox    Chronic back pain 09/13/2010   Depression 05/09/2010   Depression with anxiety 01/18/2021   Dysmenorrhea 03/08/2011   Insomnia 04/03/2010   Rosacea 02/16/2010   UTI (urinary tract infection)    Vitamin D deficiency 09/13/2010   Allergies  Allergen Reactions   Trazodone And Nefazodone     Headache   Past Surgical History:  Procedure Laterality Date   DILATION AND CURETTAGE OF UTERUS     Family History  Problem Relation Age of Onset   CAD Mother    Heart attack Mother 15   Heart disease Mother    Pulmonary fibrosis Mother     Nephrolithiasis Father    AAA (abdominal aortic aneurysm) Maternal Grandfather    Stroke Paternal Grandmother    Nephrolithiasis Paternal Grandfather    Social History   Social History Narrative   Married.  Children.   Bachelors in Occupational psychologist.  Works as a Warden/ranger.   Smoke alarms in the home.  Wears her seatbelt.   Feels safe in her relationships.    Allergies as of 10/24/2022   No Known Allergies      Medication List        Accurate as of October 24, 2022 11:59 PM. If you have any questions, ask your nurse or doctor.          STOP taking these medications    clobetasol 0.05 % external solution Commonly known as: TEMOVATE Stopped by: Felix Pacini, DO   doxycycline 100 MG capsule Commonly known as: VIBRAMYCIN Stopped by: Felix Pacini, DO   metroNIDAZOLE 1 % gel Commonly known as: METROGEL Stopped by: Felix Pacini, DO   mirtazapine 15 MG tablet Commonly known as: REMERON Stopped by: Felix Pacini, DO       TAKE these medications    ezetimibe 10 MG tablet Commonly known as: ZETIA Take 1 tablet (10 mg total) by mouth daily.   hydrochlorothiazide 12.5 MG capsule Commonly known as: MICROZIDE Take 1 capsule (12.5 mg total) by mouth daily. What changed: how much to take Changed by: Felix Pacini, DO   hydrOXYzine 10 MG tablet Commonly known as: ATARAX Take 1 tablet (10 mg total) by mouth at bedtime as needed. Started by: Felix Pacini, DO   ibuprofen 800 MG tablet Commonly known as: ADVIL Take 800 mg by mouth every 8 (eight) hours as needed.   LORazepam 0.5 MG tablet Commonly known as: ATIVAN 1/2- 1 tab PO 1 hour before flight prn Started by: Felix Pacini, DO   scopolamine 1 MG/3DAYS Commonly known as: Transderm-Scop (1.5 MG) Place 1 patch (1.5 mg total) onto the skin every 3 (three) days.        All past medical history, surgical history, allergies, family history, immunizations andmedications were updated in the EMR today and reviewed under  the history and medication portions of their EMR.     Recent Results (from the past 2160 hour(s))  CBC with Differential/Platelet     Status: Abnormal   Collection Time: 10/24/22  9:24 AM  Result Value Ref Range   WBC 6.5 4.0 - 10.5 K/uL   RBC 4.79 3.87 - 5.11 Mil/uL   Hemoglobin 16.4 (H) 12.0 - 15.0 g/dL   HCT 44.0 (H) 10.2 - 72.5 %   MCV 102.7 (H) 78.0 - 100.0 fl   MCHC 33.4 30.0 - 36.0 g/dL   RDW 36.6 44.0 - 34.7 %   Platelets 221.0 150.0 - 400.0 K/uL  Neutrophils Relative % 58.7 43.0 - 77.0 %   Lymphocytes Relative 27.1 12.0 - 46.0 %   Monocytes Relative 8.8 3.0 - 12.0 %   Eosinophils Relative 3.8 0.0 - 5.0 %   Basophils Relative 1.6 0.0 - 3.0 %   Neutro Abs 3.8 1.4 - 7.7 K/uL   Lymphs Abs 1.8 0.7 - 4.0 K/uL   Monocytes Absolute 0.6 0.1 - 1.0 K/uL   Eosinophils Absolute 0.2 0.0 - 0.7 K/uL   Basophils Absolute 0.1 0.0 - 0.1 K/uL  Comprehensive metabolic panel     Status: Abnormal   Collection Time: 10/24/22  9:24 AM  Result Value Ref Range   Sodium 140 135 - 145 mEq/L   Potassium 3.8 3.5 - 5.1 mEq/L   Chloride 97 96 - 112 mEq/L   CO2 28 19 - 32 mEq/L   Glucose, Bld 110 (H) 70 - 99 mg/dL   BUN 11 6 - 23 mg/dL   Creatinine, Ser 2.95 0.40 - 1.20 mg/dL   Total Bilirubin 0.7 0.2 - 1.2 mg/dL   Alkaline Phosphatase 74 39 - 117 U/L   AST 65 (H) 0 - 37 U/L   ALT 69 (H) 0 - 35 U/L   Total Protein 8.3 6.0 - 8.3 g/dL   Albumin 4.6 3.5 - 5.2 g/dL   GFR 62.13 >08.65 mL/min    Comment: Calculated using the CKD-EPI Creatinine Equation (2021)   Calcium 9.7 8.4 - 10.5 mg/dL  Hemoglobin H8I     Status: None   Collection Time: 10/24/22  9:24 AM  Result Value Ref Range   Hgb A1c MFr Bld 6.3 4.6 - 6.5 %    Comment: Glycemic Control Guidelines for People with Diabetes:Non Diabetic:  <6%Goal of Therapy: <7%Additional Action Suggested:  >8%   TSH     Status: None   Collection Time: 10/24/22  9:24 AM  Result Value Ref Range   TSH 3.96 0.35 - 5.50 uIU/mL  Lipid panel     Status: Abnormal    Collection Time: 10/24/22  9:24 AM  Result Value Ref Range   Cholesterol 231 (H) 0 - 200 mg/dL    Comment: ATP III Classification       Desirable:  < 200 mg/dL               Borderline High:  200 - 239 mg/dL          High:  > = 696 mg/dL   Triglycerides 295.2 0.0 - 149.0 mg/dL    Comment: Normal:  <841 mg/dLBorderline High:  150 - 199 mg/dL   HDL 32.44 >01.02 mg/dL   VLDL 72.5 0.0 - 36.6 mg/dL   LDL Cholesterol 440 (H) 0 - 99 mg/dL   Total CHOL/HDL Ratio 5     Comment:                Men          Women1/2 Average Risk     3.4          3.3Average Risk          5.0          4.42X Average Risk          9.6          7.13X Average Risk          15.0          11.0  NonHDL 179.58     Comment: NOTE:  Non-HDL goal should be 30 mg/dL higher than patient's LDL goal (i.e. LDL goal of < 70 mg/dL, would have non-HDL goal of < 100 mg/dL)  VITAMIN D 25 Hydroxy (Vit-D Deficiency, Fractures)     Status: Abnormal   Collection Time: 10/24/22  9:24 AM  Result Value Ref Range   VITD 28.94 (L) 30.00 - 100.00 ng/mL     ROS: 14 pt review of systems performed and negative (unless mentioned in an HPI)  Objective: BP 112/78   Pulse 67   Temp (!) 97.4 F (36.3 C)   Ht 5' 6.5" (1.689 m)   Wt 167 lb 9.6 oz (76 kg)   LMP 09/27/2022   SpO2 94%   BMI 26.65 kg/m  Physical Exam Vitals and nursing note reviewed.  Constitutional:      General: She is not in acute distress.    Appearance: Normal appearance. She is not ill-appearing or toxic-appearing.  HENT:     Head: Normocephalic and atraumatic.     Right Ear: Tympanic membrane, ear canal and external ear normal. There is no impacted cerumen.     Left Ear: Tympanic membrane, ear canal and external ear normal. There is no impacted cerumen.     Nose: No congestion or rhinorrhea.     Mouth/Throat:     Mouth: Mucous membranes are moist.     Pharynx: Oropharynx is clear. No oropharyngeal exudate or posterior oropharyngeal erythema.  Eyes:      General: No scleral icterus.       Right eye: No discharge.        Left eye: No discharge.     Extraocular Movements: Extraocular movements intact.     Conjunctiva/sclera: Conjunctivae normal.     Pupils: Pupils are equal, round, and reactive to light.  Cardiovascular:     Rate and Rhythm: Normal rate and regular rhythm.     Pulses: Normal pulses.     Heart sounds: Normal heart sounds. No murmur heard.    No friction rub. No gallop.  Pulmonary:     Effort: Pulmonary effort is normal. No respiratory distress.     Breath sounds: Normal breath sounds. No stridor. No wheezing, rhonchi or rales.  Chest:     Chest wall: No tenderness.  Abdominal:     General: Abdomen is flat. Bowel sounds are normal. There is no distension.     Palpations: Abdomen is soft. There is no mass.     Tenderness: There is no abdominal tenderness. There is no right CVA tenderness, left CVA tenderness, guarding or rebound.     Hernia: No hernia is present.  Musculoskeletal:        General: No swelling, tenderness or deformity. Normal range of motion.     Cervical back: Normal range of motion and neck supple. No rigidity or tenderness.     Right lower leg: No edema.     Left lower leg: No edema.  Lymphadenopathy:     Cervical: No cervical adenopathy.  Skin:    General: Skin is warm and dry.     Coloration: Skin is not jaundiced or pale.     Findings: No bruising, erythema, lesion or rash.  Neurological:     General: No focal deficit present.     Mental Status: She is alert and oriented to person, place, and time. Mental status is at baseline.     Cranial Nerves: No cranial nerve deficit.  Sensory: No sensory deficit.     Motor: No weakness.     Coordination: Coordination normal.     Gait: Gait normal.     Deep Tendon Reflexes: Reflexes normal.  Psychiatric:        Mood and Affect: Mood normal.        Behavior: Behavior normal.        Thought Content: Thought content normal.        Judgment:  Judgment normal.     No results found.  Assessment/plan: Gladine Jolliffe is a 52 y.o. female present for CPE and routine chronic conditions as well as acute concern HTN/Mixed hyperlipidemia/Hepatic steatosis -Improved Continue t HCTZ 12.5 mg.  - dietary modifications- routine exercise.  - caution on alcohol use.  -Continue Zetia -CBC, CMP, TSH and lipids collected today - [place] 12/2017 resulted with hepatic steatosis.  Follow-up 5.5 months on chronic conditions   Vitamin D deficiency -Continue supplement 1000 units daily, patient will be guided on any changes needed to supplement dose after labs are received. -Vitamin D (25 hydroxy) collected today -Continue B12  Insomnia: Patient inquired about starting a different med for her insomnia. Tried meds: Remeron gave her sleeping over even at the lowest dose possible.  Trazodone caused her to have a headache. Called and Vistaril 10 mg nightly.  Travel med for seasickness in flying phobia: Patient is asking for medications prescribed for seasickness and flying phobia today.   Seasickness: Scopolamine patches prescribed for her with proper instruction on use Flying phobia: Ativan prescribed with instruction on proper use  Encounter for general adult medical examination with abnormal findings Patient was encouraged to exercise greater than 150 minutes a week. Patient was encouraged to choose a diet filled with fresh fruits and vegetables, and lean meats. AVS provided to patient today for education/recommendation on gender specific health and safety maintenance. Colonoscopy: no fhx, cologuard completed 12/2019- 3 yr repeat Mammogram: Possible Breast cancer in [place], but not sure. completed: 2023, birads pt reported normal. Dr. Arelia Sneddon office.  Records requested Cervical cancer screening: last pap: 2019, normal per patient.  Completed by: Dr. Arelia Sneddon appt 11/06/2022 Immunizations: tdap DUE- wants wait until after vaccination,  Influenza-declined for now (encouraged yearly). shringix completed Infectious disease screening: HIV w/ pregnancy, declined repeat testing DEXA: screen at 59  Return in about 1 year (around 10/25/2023) for cpe (20 min).  Orders Placed This Encounter  Procedures   CBC with Differential/Platelet   Comprehensive metabolic panel   Hemoglobin A1c   TSH   Lipid panel   VITAMIN D 25 Hydroxy (Vit-D Deficiency, Fractures)   Cologuard   Meds ordered this encounter  Medications   scopolamine (TRANSDERM-SCOP, 1.5 MG,) 1 MG/3DAYS    Sig: Place 1 patch (1.5 mg total) onto the skin every 3 (three) days.    Dispense:  10 patch    Refill:  0   LORazepam (ATIVAN) 0.5 MG tablet    Sig: 1/2- 1 tab PO 1 hour before flight prn    Dispense:  30 tablet    Refill:  0   hydrochlorothiazide (MICROZIDE) 12.5 MG capsule    Sig: Take 1 capsule (12.5 mg total) by mouth daily.    Dispense:  90 capsule    Refill:  1   ezetimibe (ZETIA) 10 MG tablet    Sig: Take 1 tablet (10 mg total) by mouth daily.    Dispense:  90 tablet    Refill:  3    May dispense in 90 d or  30 d- with year supply- if insurance does not allow 90 d at a time.   hydrOXYzine (ATARAX) 10 MG tablet    Sig: Take 1 tablet (10 mg total) by mouth at bedtime as needed.    Dispense:  90 tablet    Refill:  1   Referral Orders  No referral(s) requested today    Electronically signed by: Felix Pacini, DO [facility]

## 2022-10-25 ENCOUNTER — Telehealth: Payer: Self-pay | Admitting: Family Medicine

## 2022-10-25 DIAGNOSIS — D751 Secondary polycythemia: Secondary | ICD-10-CM

## 2022-10-25 NOTE — Telephone Encounter (Signed)
LM for pt to return call to discuss.  

## 2022-10-25 NOTE — Telephone Encounter (Signed)
Please call patient Kidney function, thyroid function, electrolytes are normal. Liver enzymes are still mildly elevated but stable from prior collections.  Vitamin D just mildly low at 28.9.  I would encourage her to increase vitamin D supplement daily to by 1000 units.  Her diabetes screening/A1c is a little higher this year in the prediabetic range at 6.3.  Increasing exercise and cutting back on sugar content in diet will help her avoid progressing to a diabetic and requiring medication.  Cholesterol is higher than goal at 153.  We did call in the Zetia refill for her.  Recommend she take this routinely and we would need to follow-up in 3 months for recheck.  Please schedule this for her   Her hemoglobin is elevated, more so than prior,as is her hematocrit and  size of cells. -We had referred her to hematology in the past, I do not believe she established.  We do need to revisit this issue since it is worsening and her liver enzymes remain elevated.  Recommendations for now are she start a B complex vitamin daily. I also placed an additional lab for her to have drawn by lab appointment only within the next 2 weeks, which is an iron panel to ensure iron overload is not the cause of the abnormalities.  There is a condition called hemochromatosis which can cause the above abnormalities.

## 2022-10-28 ENCOUNTER — Telehealth: Payer: Self-pay | Admitting: Family Medicine

## 2022-10-28 MED ORDER — HYDROXYZINE HCL 10 MG PO TABS: 10.0000 mg | ORAL_TABLET | Freq: Every evening | ORAL | 1 refills | Status: DC | PRN

## 2022-10-28 NOTE — Telephone Encounter (Signed)
Please inform patient I have called in a medication called hydroxyzine 10 mg tab for her to start approximately 1 hour before bed to help with her sleep disturbance.

## 2022-10-29 NOTE — Telephone Encounter (Signed)
Left pt VM to discuss

## 2022-11-11 ENCOUNTER — Ambulatory Visit (INDEPENDENT_AMBULATORY_CARE_PROVIDER_SITE_OTHER): Payer: BC Managed Care – PPO

## 2022-11-11 VITALS — BP 112/78

## 2022-11-11 DIAGNOSIS — Z23 Encounter for immunization: Secondary | ICD-10-CM | POA: Diagnosis not present

## 2022-11-11 NOTE — Progress Notes (Unsigned)
Patient came for tdap vaccine, tolerated well.

## 2023-04-27 ENCOUNTER — Other Ambulatory Visit: Payer: Self-pay | Admitting: Family Medicine

## 2023-05-30 ENCOUNTER — Other Ambulatory Visit: Payer: Self-pay | Admitting: Family Medicine

## 2023-06-03 ENCOUNTER — Other Ambulatory Visit: Payer: Self-pay | Admitting: Family Medicine

## 2023-11-03 ENCOUNTER — Other Ambulatory Visit: Payer: Self-pay

## 2023-11-03 ENCOUNTER — Other Ambulatory Visit: Payer: Self-pay | Admitting: Family Medicine

## 2023-11-03 ENCOUNTER — Encounter: Payer: Self-pay | Admitting: Family Medicine

## 2023-11-03 DIAGNOSIS — R7989 Other specified abnormal findings of blood chemistry: Secondary | ICD-10-CM

## 2023-11-03 DIAGNOSIS — E782 Mixed hyperlipidemia: Secondary | ICD-10-CM

## 2023-11-03 NOTE — Telephone Encounter (Signed)
 Refills requested for lorazepam  and ezetimbe. Pts next CPE scheduled for 8/12. Pt uses [facility] on [address].

## 2023-11-04 MED ORDER — EZETIMIBE 10 MG PO TABS
10.0000 mg | ORAL_TABLET | Freq: Every day | ORAL | 0 refills | Status: AC
Start: 2023-11-04 — End: ?

## 2023-11-04 NOTE — Addendum Note (Signed)
 Addended by: Napolean Backbone A on: 11/04/2023 08:22 AM   Modules accepted: Orders

## 2023-11-04 NOTE — Telephone Encounter (Signed)
 Refilled zetia  for her as requested Since ativan  is a controlled substance, Ativan  can not be refilled without an appt with provider.

## 2023-12-30 ENCOUNTER — Encounter: Admitting: Family Medicine

## 2024-02-24 ENCOUNTER — Encounter: Admitting: Family Medicine

## 2024-04-12 ENCOUNTER — Encounter: Admitting: Family Medicine
